# Patient Record
Sex: Female | Born: 1955 | Race: White | Hispanic: No | Marital: Single | Smoking: Current every day smoker
Health system: Southern US, Community
[De-identification: ages and names within clinical notes are randomized; demographics above are authoritative.]

## PROBLEM LIST (undated history)

## (undated) DIAGNOSIS — J449 Chronic obstructive pulmonary disease, unspecified: Secondary | ICD-10-CM

## (undated) DIAGNOSIS — K769 Liver disease, unspecified: Secondary | ICD-10-CM

## (undated) DIAGNOSIS — M1990 Unspecified osteoarthritis, unspecified site: Secondary | ICD-10-CM

## (undated) HISTORY — PX: ESOPHAGOGASTRODUODENOSCOPY: SHX1529

---

## 2016-03-17 ENCOUNTER — Ambulatory Visit (HOSPITAL_COMMUNITY): Payer: Medicaid Other | Attending: Family Medicine | Admitting: Physical Therapy

## 2016-03-17 DIAGNOSIS — M25562 Pain in left knee: Secondary | ICD-10-CM | POA: Insufficient documentation

## 2016-03-17 DIAGNOSIS — M6281 Muscle weakness (generalized): Secondary | ICD-10-CM | POA: Insufficient documentation

## 2016-03-17 DIAGNOSIS — M25561 Pain in right knee: Secondary | ICD-10-CM | POA: Insufficient documentation

## 2016-03-17 NOTE — Therapy (Signed)
[facility] [address] [place], [place], [address] Phone: [phone]   Fax:  [phone]  Physical Therapy Evaluation  Patient Details  Name: Alexis FarrierDawn Blankenship MRN: [id number] Date of Birth: 03/27/1956 Referring Provider: Francene BoyersSusan Peterson   Encounter Date: 03/17/2016      Alexis End of Session - 03/17/16 0940    Visit Number 1   Number of Visits 1   Authorization Type medicaid   Alexis Start Time 0904   Alexis Stop Time 0941   Alexis Time Calculation (min) 37 min   Activity Tolerance Patient tolerated treatment well   Behavior [facility] for tasks assessed/performed      No past medical history on file.  No past surgical history on file.  There were no vitals filed for this visit.       Subjective Assessment - 03/17/16 0906    Subjective Alexis Blankenship states that she has had knee pain since 1999 at this time she weighed over 900 lbs.  She has noticed increased knee pain in both knees in the past several months.  It is difficult for her to get up from a chair and has periodic sharp pains where it feels like her knee is going to give way.     How long can you sit comfortably? no problem    How long can you stand comfortably? 5 minutes    How long can you walk comfortably? 20 minutes    Currently in Pain? Yes   Pain Score 8    Pain Location Knee   Pain Orientation Right;Left   Pain Descriptors / Indicators Aching;Sharp   Pain Type Chronic pain   Pain Onset More than a month ago   Pain Frequency Constant   Aggravating Factors  standing or walking    Pain Relieving Factors sit down            Alexis Blankenship Alexis Assessment - 03/17/16 0001    Assessment   Medical Diagnosis Bilateral knee pain   Referring Provider Francene BoyersSusan Peterson    Onset Date/Surgical Date 12/08/15  for chronic condition   Next MD Visit unknown   Prior Therapy none   Precautions   Precautions None   Restrictions   Weight Bearing Restrictions No   Balance Screen   Has the  patient fallen in the past 6 months Yes   How many times? 1   Has the patient had a decrease in activity level because of a fear of falling?  Yes   Is the patient reluctant to leave their home because of a fear of falling?  No   Home [employer] manag[employer]   Living Environment Private residence   Prior Function   Level of Independence Independent   Vocation On disability   Leisure would like to walk but can't; watch TV, internet    Cognition   Overall Cognitive Status Within Functional Limits for tasks assessed   ROM / Strength   AROM / PROM / Strength Strength   Strength   Strength Assessment Site Hip;Knee;Ankle   Right/Left Hip Right;Left   Right Hip Flexion 2+/5   Right Hip Extension 3/5   Right Hip ABduction 4/5   Left Hip Flexion 2+/5   Left Hip Extension 3/5   Left Hip ABduction 4/5   Right/Left Knee Right;Left   Right Knee Extension 3+/5   Left Knee Extension 3+/5   Right/Left Ankle Right;Left   Right Ankle Dorsiflexion 4-/5   Left Ankle Dorsiflexion 4-/5  Alexis Education - 03/17/16 0934    Education provided Yes   Education Details Home exercise program for strengthening    Person(s) Educated Patient   Methods Explanation   Comprehension Verbalized understanding          Alexis Short Term Goals - 03/17/16 0945    Alexis SHORT TERM GOAL #1   Title Alexis to be independent in a home exercise program to improve strength and stability in both knees in order to decrease Alexis pain to be able to walk for 20 minutes without increased pain    Time 1   Period Days   Status On-going                  Plan - 03/17/16 0940    Clinical Impression Statement Alexis Blankenship is a 60 yo female who has chronic bilateral knee pain.  The patient is obese but has already lost over 400 lbs.  Her knee pain has exacerbated in the past three months to a point that both knees will give way on her without warning.  She has been referred to skilled Alexis for evaluation  and treatment.  Evaluation demonstrates decreased strength in Alexis core, hip, knee and ankle musculature.  Alexis Blankenship insurance will only cover and evaluation therefore she desires to be given a home exercise program .   Rehab Potential Good   Alexis Frequency 1x / week   Alexis Duration --  1 week   Alexis Treatment/Interventions ADLs/Self Care Home Management;Therapeutic exercise;Patient/family education   Alexis Next Visit Plan Alexis to be discharge to a home exercise program.       Patient will benefit from skilled therapeutic intervention in order to improve the following deficits and impairments:  Abnormal gait, Decreased activity tolerance, Decreased balance, Difficulty walking, Decreased strength, Pain  Visit Diagnosis: Pain in right knee - Plan: Alexis plan of care cert/re-cert  Pain in left knee - Plan: Alexis plan of care cert/re-cert  Muscle weakness (generalized) - Plan: Alexis plan of care cert/re-cert     Problem List There are no active problems to display for this patient.   Alexis Blankenship, Alexis Blankenship [phone] 03/17/2016, 9:49 AM  [facility] [address] [place], [place], [address] Phone: [phone]   Fax:  [phone]  Name: Alexis Blankenship MRN: [id number] Date of Birth: 02/06/1956

## 2016-03-17 NOTE — Patient Instructions (Addendum)
Toe Raise (Sitting)    Raise toes, keeping heels on floor. Then raise heels off the floor  Repeat _10-15___ times per set. Do __1__ sets per session. Do _3-4___ sessions per day.  http://orth.exer.us/46   Copyright  VHI. All rights reserved.  Knee Extension (Sitting)    Place __0__ pound weight on left ankle and straighten knee fully, lower slowly. Repeat __10-15__ times per set. Do _1__ sets per session. Do _3-4___ sessions per day.  http://orth.exer.us/732   Copyright  VHI. All rights reserved.  Heel Raise: Bilateral (Standing)    Rise on balls of feet. Repeat _10___ times per set. Do __1__ sets per session. Do _2___ sessions per day.  http://orth.exer.us/38   Copyright  VHI. All rights reserved.  Functional Quadriceps: Chair Squat   Hold for now.  Keeping feet flat on floor, shoulder width apart, squat as low as is comfortable. Use support as necessary. Repeat _10___ times per set. Do __1__ sets per session. Do __2__ sessions per day.  http://orth.exer.us/736   Copyright  VHI. All rights reserved.  Balance: Unilateral    Attempt to balance on left leg, eyes open. Hold _30___ seconds. Repeat __5__ times per set. Do _1___ sets per session. Do _2___ sessions per day. Perform exercise with eyes closed.  http://orth.exer.us/28   Copyright  VHI. All rights reserved.  Strengthening: Straight Leg Raise (Phase 3)    Resting on hands, tighten muscles on front of left thigh, then lift leg ___2_ inches from surface, keeping knee locked. Repeat _10___ times per set. Do _1___ sets per session. Do __2__ sessions per day.  http://orth.exer.us/618   Copyright  VHI. All rights reserved.  Strengthening: Hip Extension (Prone)   Tighten your stomach as well as your buttocks.  Then  Tighten muscles on front of left thigh, then lift leg _2___ inches from surface, keeping knee locked. Repeat _10___ times per set. Do __1__ sets per session. Do ___1_ sessions per  day.  http://orth.exer.us/620   Copyright  VHI. All rights reserved.  Self-Mobilization: Knee Flexion (Prone)    Bring left heel toward buttocks as close as possible. Hold _2___ seconds. Relax. Repeat __10__ times per set. Do __1__ sets per session. Do __2__ sessions per day.  http://orth.exer.us/596   Copyright  VHI. All rights reserved.

## 2019-09-26 ENCOUNTER — Inpatient Hospital Stay (HOSPITAL_COMMUNITY)
Admission: EM | Admit: 2019-09-26 | Discharge: 2019-10-03 | DRG: 442 | Disposition: A | Discharge status: Home / Self Care | Payer: Medicaid Other | Attending: Internal Medicine | Admitting: Internal Medicine

## 2019-09-26 ENCOUNTER — Encounter (HOSPITAL_COMMUNITY): Payer: Self-pay | Admitting: *Deleted

## 2019-09-26 ENCOUNTER — Emergency Department (HOSPITAL_COMMUNITY): Payer: Medicaid Other

## 2019-09-26 ENCOUNTER — Other Ambulatory Visit: Payer: Self-pay

## 2019-09-26 DIAGNOSIS — Z8249 Family history of ischemic heart disease and other diseases of the circulatory system: Secondary | ICD-10-CM

## 2019-09-26 DIAGNOSIS — K7581 Nonalcoholic steatohepatitis (NASH): Principal | ICD-10-CM | POA: Diagnosis present

## 2019-09-26 DIAGNOSIS — D61818 Other pancytopenia: Secondary | ICD-10-CM | POA: Diagnosis present

## 2019-09-26 DIAGNOSIS — R601 Generalized edema: Secondary | ICD-10-CM | POA: Diagnosis not present

## 2019-09-26 DIAGNOSIS — Z6841 Body Mass Index (BMI) 40.0 and over, adult: Secondary | ICD-10-CM

## 2019-09-26 DIAGNOSIS — F17210 Nicotine dependence, cigarettes, uncomplicated: Secondary | ICD-10-CM | POA: Diagnosis present

## 2019-09-26 DIAGNOSIS — Z833 Family history of diabetes mellitus: Secondary | ICD-10-CM

## 2019-09-26 DIAGNOSIS — Z20828 Contact with and (suspected) exposure to other viral communicable diseases: Secondary | ICD-10-CM | POA: Diagnosis present

## 2019-09-26 DIAGNOSIS — J449 Chronic obstructive pulmonary disease, unspecified: Secondary | ICD-10-CM | POA: Diagnosis present

## 2019-09-26 DIAGNOSIS — E876 Hypokalemia: Secondary | ICD-10-CM | POA: Diagnosis present

## 2019-09-26 DIAGNOSIS — R188 Other ascites: Secondary | ICD-10-CM

## 2019-09-26 DIAGNOSIS — E8779 Other fluid overload: Secondary | ICD-10-CM | POA: Diagnosis present

## 2019-09-26 DIAGNOSIS — E8809 Other disorders of plasma-protein metabolism, not elsewhere classified: Secondary | ICD-10-CM | POA: Diagnosis present

## 2019-09-26 DIAGNOSIS — K7460 Unspecified cirrhosis of liver: Secondary | ICD-10-CM | POA: Diagnosis present

## 2019-09-26 HISTORY — DX: Unspecified osteoarthritis, unspecified site: M19.90

## 2019-09-26 HISTORY — DX: Liver disease, unspecified: K76.9

## 2019-09-26 HISTORY — DX: Chronic obstructive pulmonary disease, unspecified: J44.9

## 2019-09-26 HISTORY — DX: Morbid (severe) obesity due to excess calories: E66.01

## 2019-09-26 LAB — COMPREHENSIVE METABOLIC PANEL
ALT: 23 U/L (ref 0–44)
AST: 42 U/L — ABNORMAL HIGH (ref 15–41)
Albumin: 2.4 g/dL — ABNORMAL LOW (ref 3.5–5.0)
Alkaline Phosphatase: 74 U/L (ref 38–126)
Anion gap: 7 (ref 5–15)
BUN: 13 mg/dL (ref 6–20)
CO2: 24 mmol/L (ref 22–32)
Calcium: 8.4 mg/dL — ABNORMAL LOW (ref 8.9–10.3)
Chloride: 110 mmol/L (ref 98–111)
Creatinine, Ser: 0.75 mg/dL (ref 0.44–1.00)
GFR calc Af Amer: >60 mL/min (ref >60)
GFR calc non Af Amer: >60 mL/min (ref >60)
Glucose, Bld: 94 mg/dL (ref 70–99)
Potassium: 3.3 mmol/L — ABNORMAL LOW (ref 3.5–5.1)
Sodium: 141 mmol/L (ref 135–145)
Total Bilirubin: 1.1 mg/dL (ref 0.3–1.2)
Total Protein: 6.7 g/dL (ref 6.5–8.1)

## 2019-09-26 LAB — LIPASE, BLOOD: Lipase: 24 U/L (ref 11–51)

## 2019-09-26 LAB — CBC WITH DIFFERENTIAL/PLATELET
Abs Immature Granulocytes: 0.01 10*3/uL (ref 0.00–0.07)
Basophils Absolute: 0 10*3/uL (ref 0.0–0.1)
Basophils Relative: 1 %
Eosinophils Absolute: 0.2 10*3/uL (ref 0.0–0.5)
Eosinophils Relative: 6 %
HCT: 45.2 % (ref 36.0–46.0)
Hemoglobin: 14.2 g/dL (ref 12.0–15.0)
Immature Granulocytes: 0 %
Lymphocytes Relative: 24 %
Lymphs Abs: 0.8 10*3/uL (ref 0.7–4.0)
MCH: 32.9 pg (ref 26.0–34.0)
MCHC: 31.4 g/dL (ref 30.0–36.0)
MCV: 104.6 fL — ABNORMAL HIGH (ref 80.0–100.0)
Monocytes Absolute: 0.4 10*3/uL (ref 0.1–1.0)
Monocytes Relative: 11 %
Neutro Abs: 1.9 10*3/uL (ref 1.7–7.7)
Neutrophils Relative %: 58 %
Platelets: 98 10*3/uL — ABNORMAL LOW (ref 150–400)
RBC: 4.32 MIL/uL (ref 3.87–5.11)
RDW: 15 % (ref 11.5–15.5)
WBC: 3.3 10*3/uL — ABNORMAL LOW (ref 4.0–10.5)
nRBC: 0 % (ref 0.0–0.2)

## 2019-09-26 LAB — PHOSPHORUS: Phosphorus: 3.3 mg/dL (ref 2.5–4.6)

## 2019-09-26 LAB — MAGNESIUM: Magnesium: 2.1 mg/dL (ref 1.7–2.4)

## 2019-09-26 MED ORDER — POTASSIUM CHLORIDE CRYS ER 20 MEQ PO TBCR
40.0000 meq | EXTENDED_RELEASE_TABLET | Freq: Once | ORAL | Status: AC
  Administered 2019-09-26: 40 meq via ORAL
  Filled 2019-09-26: qty 2

## 2019-09-26 MED ORDER — FUROSEMIDE 10 MG/ML IJ SOLN
40.0000 mg | Freq: Once | INTRAMUSCULAR | Status: AC
  Administered 2019-09-26: 40 mg via INTRAVENOUS
  Filled 2019-09-26: qty 4

## 2019-09-26 MED ORDER — IOHEXOL 300 MG/ML  SOLN
100.0000 mL | Freq: Once | INTRAMUSCULAR | Status: AC | PRN
  Administered 2019-09-26: 100 mL via INTRAVENOUS

## 2019-09-26 NOTE — ED Triage Notes (Signed)
Pt brought in by [facility] with c/o increasing abdominal fluid and swelling in legs. Pt reports she has liver disease but hasn't been taking her medications for her liver in over 6 months because she lost her doctor.

## 2019-09-26 NOTE — ED Provider Notes (Signed)
[facility] EMERGENCY DEPARTMENT Provider Note   CSN: [id number] Arrival date & time: 09/26/19  1655     History   Chief Complaint Chief Complaint  Patient presents with   Abdominal Swelling     HPI Alexis Blankenship is a 63 y.o. female.     HPI Patient is a poor historian.  States she has some type of liver disease which she was diagnosed with 4 years ago but not seen her doctor for 6 months.  She is also been off of her "fluid medication" but is unsure what this medicine loss.  She is had increased abdominal swelling as well as bilateral lower extremity swelling.  She has occasional right-sided abdominal pain though denies any currently.  She denies any nausea or vomiting.  She not had any melanotic or grossly bloody stool.  No fever or chills.  States she did have a paracentesis once about 4 years ago. Past Medical History:  Diagnosis Date   Arthritis    in knees   COPD (chronic obstructive pulmonary disease) ([place])    Liver disease    Morbid obesity ([place])     Patient Active Problem List   Diagnosis Date Noted   Anasarca 09/26/2019    Past Surgical History:  Procedure Laterality Date   CESAREAN SECTION       OB History   No obstetric history on file.      Home Medications    Prior to Admission medications   Not on File    Family History No family history on file.  Social History Social History   Tobacco Use   Smoking status: Current Every Day Smoker    Packs/day: 0.50    Types: Cigarettes   Smokeless tobacco: Never Used  Substance Use Topics   Alcohol use: Never    Frequency: Never   Drug use: Never     Allergies   Patient has no known allergies.   Review of Systems Review of Systems  Constitutional: Negative for chills and fever.  HENT: Negative for sore throat and trouble swallowing.   Eyes: Negative for visual disturbance.  Respiratory: Negative for cough and shortness of breath.   Cardiovascular: Positive for leg swelling.  Negative for chest pain.  Gastrointestinal: Positive for abdominal distention and abdominal pain. Negative for constipation, diarrhea, nausea and vomiting.  Genitourinary: Negative for dysuria, flank pain and frequency.  Musculoskeletal: Negative for back pain, myalgias, neck pain and neck stiffness.  Skin: Negative for rash and wound.  Neurological: Negative for dizziness, weakness, light-headedness, numbness and headaches.  All other systems reviewed and are negative.    Physical Exam Updated Vital Signs BP 139/82    Pulse 96    Temp 97.8 F (36.6 C) (Oral)    Resp 15    Ht 5\' 7"  (1.702 m)    Wt (!) 157.9 kg    SpO2 95%    BMI 54.50 kg/m   Physical Exam Vitals signs and nursing note reviewed.  Constitutional:      General: She is not in acute distress.    Appearance: Normal appearance. She is well-developed. She is obese. She is not ill-appearing.  HENT:     Head: Normocephalic and atraumatic.     Nose: Nose normal.     Mouth/Throat:     Mouth: Mucous membranes are moist.  Eyes:     Pupils: Pupils are equal, round, and reactive to light.  Neck:     Musculoskeletal: Normal range of motion and neck supple.  No neck rigidity or muscular tenderness.  Cardiovascular:     Rate and Rhythm: Normal rate and regular rhythm.     Heart sounds: No murmur. No friction rub. No gallop.   Pulmonary:     Effort: Pulmonary effort is normal. No respiratory distress.     Breath sounds: Normal breath sounds. No stridor. No wheezing, rhonchi or rales.  Chest:     Chest wall: No tenderness.  Abdominal:     General: Bowel sounds are normal. There is distension.     Palpations: Abdomen is soft.     Tenderness: There is no abdominal tenderness. There is no guarding or rebound.     Comments: Distended abdomen right greater than left side.  Soft, nontender.  No rebound or guarding.  Musculoskeletal: Normal range of motion.        General: No tenderness.     Right lower leg: Edema present.     Left  lower leg: Edema present.     Comments: 2+ bilateral lower extremity pitting edema.  Distal pulses intact.  Patient does have a nonruptured blister to the lateral right lower leg.  Lymphadenopathy:     Cervical: No cervical adenopathy.  Skin:    General: Skin is warm and dry.     Findings: No erythema or rash.  Neurological:     General: No focal deficit present.     Mental Status: She is alert and oriented to person, place, and time.  Psychiatric:        Behavior: Behavior normal.      ED Treatments / Results  Labs (all labs ordered are listed, but only abnormal results are displayed) Labs Reviewed  CBC WITH DIFFERENTIAL/PLATELET - Abnormal; Notable for the following components:      Result Value   WBC 3.3 (*)    MCV 104.6 (*)    Platelets 98 (*)    All other components within normal limits  COMPREHENSIVE METABOLIC PANEL - Abnormal; Notable for the following components:   Potassium 3.3 (*)    Calcium 8.4 (*)    Albumin 2.4 (*)    AST 42 (*)    All other components within normal limits  LIPASE, BLOOD  URINALYSIS, ROUTINE W REFLEX MICROSCOPIC    EKG None  Radiology Ct Abdomen Pelvis W Contrast  Result Date: 09/26/2019 CLINICAL DATA:  Abdomen distension EXAM: CT ABDOMEN AND PELVIS WITH CONTRAST TECHNIQUE: Multidetector CT imaging of the abdomen and pelvis was performed using the standard protocol following bolus administration of intravenous contrast. CONTRAST:  OMNIPAQUE IOHEXOL 300 MG/ML  SOLN COMPARISON:  CT 07/28/2015 FINDINGS: Lower chest: Lung bases demonstrate no acute consolidation or pleural effusion. The heart size is within normal limits. Mitral calcification. Hepatobiliary: Lobulated liver contour consistent with cirrhosis. No focal hepatic abnormality. Multiple stones are hyperdense sludge in the gallbladder. No biliary dilatation Pancreas: Atrophic.  No inflammatory change Spleen: Enlarged measuring 17 cm craniocaudad. Adrenals/Urinary Tract: Adrenal  glands are unremarkable. Kidneys are normal, without renal calculi, focal lesion, or hydronephrosis. Bladder is unremarkable. Stomach/Bowel: Stomach is within normal limits. Appendix not well seen. No evidence of bowel wall thickening, distention, or inflammatory changes. Vascular/Lymphatic: Moderate aortic atherosclerosis. No aneurysm. Subcentimeter upper abdominal and retroperitoneal nodes. Small distal esophageal and perigastric varices. Recanalized paraumbilical vein. Reproductive: Uterus and bilateral adnexa are unremarkable. Other: No free air. Moderate to large volume of ascites. Edema within the subcutaneous fat of the body wall. Musculoskeletal: No acute or suspicious osseous abnormality. IMPRESSION: 1. Hepatic cirrhosis with  portal hypertension as evidenced by splenomegaly and upper abdominal varices. 2. Moderate to large volume of ascites in the abdomen 3. Gallstones are hyperdense sludge in the gallbladder Electronically Signed   By: Jasmine PangKim  Fujinaga M.D.   On: 09/26/2019 22:48    Procedures Procedures (including critical care time)  Medications Ordered in ED Medications  furosemide (LASIX) injection 40 mg (has no administration in time range)  iohexol (OMNIPAQUE) 300 MG/ML solution 100 mL (100 mLs Intravenous Contrast Given 09/26/19 2213)     Initial Impression / Assessment and Plan / ED Course  I have reviewed the triage vital signs and the nursing notes.  Pertinent labs & imaging results that were available during my care of the patient were reviewed by me and considered in my medical decision making (see chart for details).        Large volume ascites on CT scan.  Will give IV Lasix and discussed with hospitalist regarding admission for therapeutic paracentesis.  Final Clinical Impressions(s) / ED Diagnoses   Final diagnoses:  Anasarca    ED Discharge Orders    None       Loren RacerYelverton, Stashia Sia, MD 09/26/19 2324

## 2019-09-26 NOTE — H&P (Signed)
History and Physical    Alexis FarrierDawn Reetz ZOX:[id number] DOB: 11/06/1956 DOA: 09/26/2019  [facility]   Patient coming from: Home.  I have personally briefly reviewed patient's old medical [facility]  Chief Complaint: Swelling.  HPI: Alexis Blankenship is a 63 y.o. female with medical history significant of osteoarthritis of the knees, COPD, morbid obesity, liver cirrhosis due to Alexis Blankenship who is coming to the emergency department due to progressively worse edema for the past month or so.  She states she has not taken her medications in several months due to lack of health care insurance.  She complains of dyspnea on exertion and orthopnea.  She denies fever, chills, sore throat, rhinorrhea, chest pain, palpitations, diaphoresis, nausea, emesis, diarrhea, melena or hematochezia.  She gets constipation occasionally.  No dysuria, frequency or hematuria.  Denies polyuria, polydipsia, polyphagia or blurred vision.  ED Course: She was giving 40 mg of furosemide in the ED.  Her urinalysis shows small hemoglobinuria, but was otherwise negative.  White count is 3.3, hemoglobin 14.2 g/dL and platelets 98.  CMP shows a potassium of 3.3 mmol/L.  All other electrolytes are within expected range, when calcium is corrected to an albumin of 2.4 g/dL.  AST was slightly elevated at 42 units/L.  The rest of the hepatic function tests are normal.  Lipase, phosphorus and magnesium were normal.  Imaging: CT abdomen shows hepatic cirrhosis with portal hypertension as evidenced by splenomegaly and upper abdominal varices.  There are gallstones and sludge in the gallbladder.  Please see images and full radiology report for further detail.  Review of Systems: As per HPI otherwise 10 point review of systems negative.   Past Medical History:  Diagnosis Date  . Arthritis    in knees  . COPD (chronic obstructive pulmonary disease) (H[facility])   . Liver disease   . Morbid obesity ([facility])      Past Surgical History:  Procedure Laterality Date  . CESAREAN SECTION       reports that she has been smoking cigarettes. She has been smoking about 0.50 packs per day. She has never used smokeless tobacco. She reports that she does not drink alcohol or use drugs.  No Known Allergies  Family medical history. Father heart disease and diabetes. Mother diabetes. Multiple siblings with type 2 diabetes.  Prior to Admission medications   Not on File    Physical Exam: Vitals:   09/26/19 1736 09/26/19 2040 09/26/19 2200 09/26/19 2230  BP: (!) 145/89 (!) 151/96 125/86 139/82  Pulse: 94 90 86 96  Resp: 18 16 (!) 25 15  Temp: 97.8 F (36.6 C)     TempSrc: Oral     SpO2: 99% 98% 98% 95%  Weight:      Height:        Constitutional: NAD, calm, comfortable Eyes: PERRL, lids and conjunctivae normal ENMT: Mucous membranes are moist. Posterior pharynx clear of any exudate or lesions. Neck: normal, supple, no masses, no thyromegaly Respiratory: Decreased breath sounds on bases, otherwise clear to auscultation bilaterally, no wheezing, no crackles. Normal respiratory effort. No accessory muscle use.  Cardiovascular: Regular rate and rhythm, no murmurs / rubs / gallops.  3+ lower extremities edema. 2+ pedal pulses. No carotid bruits.  Abdomen: Obese, distended due to anasarca.  Soft, mild diffuse tenderness without guarding or rebound, no masses palpated. No hepatosplenomegaly. Bowel sounds positive.  Musculoskeletal: no clubbing / cyanosis.  Good ROM, no contractures. Normal muscle tone.  Skin:  no rashes, lesions, ulcers on limited dermatological examination. Neurologic: CN 2-12 grossly intact. Sensation intact, DTR normal. Strength 5/5 in all 4.  Psychiatric: Normal judgment and insight. Alert and oriented x 3. Normal mood.   Labs on Admission: I have personally reviewed following labs and imaging studies  CBC: Recent Labs  Lab 09/26/19 2115  WBC 3.3*  NEUTROABS 1.9  HGB 14.2  HCT  45.2  MCV 104.6*  PLT 98*   Basic Metabolic Panel: Recent Labs  Lab 09/26/19 2115  NA 141  K 3.3*  CL 110  CO2 24  GLUCOSE 94  BUN 13  CREATININE 0.75  CALCIUM 8.4*   GFR: Estimated Creatinine Clearance: 118.2 mL/min (by C-G formula based on SCr of 0.75 mg/dL). Liver Function Tests: Recent Labs  Lab 09/26/19 2115  AST 42*  ALT 23  ALKPHOS 74  BILITOT 1.1  PROT 6.7  ALBUMIN 2.4*   Recent Labs  Lab 09/26/19 2115  LIPASE 24   No results for input(s): AMMONIA in the last 168 hours. Coagulation Profile: No results for input(s): INR, PROTIME in the last 168 hours. Cardiac Enzymes: No results for input(s): CKTOTAL, CKMB, CKMBINDEX, TROPONINI in the last 168 hours. BNP (last 3 results) No results for input(s): PROBNP in the last 8760 hours. HbA1C: No results for input(s): HGBA1C in the last 72 hours. CBG: No results for input(s): GLUCAP in the last 168 hours. Lipid Profile: No results for input(s): CHOL, HDL, LDLCALC, TRIG, CHOLHDL, LDLDIRECT in the last 72 hours. Thyroid Function Tests: No results for input(s): TSH, T4TOTAL, FREET4, T3FREE, THYROIDAB in the last 72 hours. Anemia Panel: No results for input(s): VITAMINB12, FOLATE, FERRITIN, TIBC, IRON, RETICCTPCT in the last 72 hours. Urine analysis: No results found for: COLORURINE, APPEARANCEUR, LABSPEC, [place], GLUCOSEU, HGBUR, BILIRUBINUR, KETONESUR, PROTEINUR, UROBILINOGEN, NITRITE, LEUKOCYTESUR  Radiological Exams on Admission: Ct Abdomen Pelvis W Contrast  Result Date: 09/26/2019 CLINICAL DATA:  Abdomen distension EXAM: CT ABDOMEN AND PELVIS WITH CONTRAST TECHNIQUE: Multidetector CT imaging of the abdomen and pelvis was performed using the standard protocol following bolus administration of intravenous contrast. CONTRAST:  162mL OMNIPAQUE IOHEXOL 300 MG/ML  SOLN COMPARISON:  CT 07/28/2015 FINDINGS: Lower chest: Lung bases demonstrate no acute consolidation or pleural effusion. The heart size is within normal  limits. Mitral calcification. Hepatobiliary: Lobulated liver contour consistent with cirrhosis. No focal hepatic abnormality. Multiple stones are hyperdense sludge in the gallbladder. No biliary dilatation Pancreas: Atrophic.  No inflammatory change Spleen: Enlarged measuring 17 cm craniocaudad. Adrenals/Urinary Tract: Adrenal glands are unremarkable. Kidneys are normal, without renal calculi, focal lesion, or hydronephrosis. Bladder is unremarkable. Stomach/Bowel: Stomach is within normal limits. Appendix not well seen. No evidence of bowel wall thickening, distention, or inflammatory changes. Vascular/Lymphatic: Moderate aortic atherosclerosis. No aneurysm. Subcentimeter upper abdominal and retroperitoneal nodes. Small distal esophageal and perigastric varices. Recanalized paraumbilical vein. Reproductive: Uterus and bilateral adnexa are unremarkable. Other: No free air. Moderate to large volume of ascites. Edema within the subcutaneous fat of the body wall. Musculoskeletal: No acute or suspicious osseous abnormality. IMPRESSION: 1. Hepatic cirrhosis with portal hypertension as evidenced by splenomegaly and upper abdominal varices. 2. Moderate to large volume of ascites in the abdomen 3. Gallstones are hyperdense sludge in the gallbladder Electronically Signed   By: Donavan Foil M.D.   On: 09/26/2019 22:48    EKG: Independently reviewed.   Assessment/Plan Principal Problem:   Anasarca Observation/telemetry. Supplemental oxygen as needed. Fluid and sodium restriction. Furosemide 60 mg IVP twice a day. Consider IR guided  therapeutic paracentesis.  Active Problems:   Liver cirrhosis secondary to NASH (HCC) Child-Pugh Score of 9. MELD Score of 8. Continue treatment as above. Outpatient or inpatient evaluation by GI. She will require EGD and potentially esophageal varices banding.    Morbid obesity (HCC) Needs significant lifestyle modifications.    COPD (chronic obstructive pulmonary  disease) (HCC) Smoking cessation recommended. Nicotine patch offered. Supplemental oxygen and bronchodilators as needed.    Hypokalemia Supplemented potassium. Follow-up level.   DVT prophylaxis: SCDs. Code Status: Full code. Family Communication: Disposition Plan: 24 to 48-hour observation for IV furosemide treatment. Consults called: Admission status: Observation/telemetry.   Bobette Mo MD [facility]  If 7PM-7AM, please contact night-coverage  09/26/2019, 11:33 PM   This document was prepared using Dragon voice recognition software and may contain some unintended transcription errors.

## 2019-09-26 NOTE — ED Notes (Signed)
Pt has fluid filled blister to R lower leg

## 2019-09-27 ENCOUNTER — Encounter (HOSPITAL_COMMUNITY): Payer: Self-pay | Admitting: Internal Medicine

## 2019-09-27 ENCOUNTER — Observation Stay (HOSPITAL_COMMUNITY): Payer: Medicaid Other

## 2019-09-27 ENCOUNTER — Telehealth: Payer: Self-pay | Admitting: Gastroenterology

## 2019-09-27 DIAGNOSIS — E8809 Other disorders of plasma-protein metabolism, not elsewhere classified: Secondary | ICD-10-CM | POA: Diagnosis present

## 2019-09-27 DIAGNOSIS — D61818 Other pancytopenia: Secondary | ICD-10-CM | POA: Diagnosis present

## 2019-09-27 DIAGNOSIS — F17210 Nicotine dependence, cigarettes, uncomplicated: Secondary | ICD-10-CM | POA: Diagnosis present

## 2019-09-27 DIAGNOSIS — Z8249 Family history of ischemic heart disease and other diseases of the circulatory system: Secondary | ICD-10-CM | POA: Diagnosis not present

## 2019-09-27 DIAGNOSIS — E876 Hypokalemia: Secondary | ICD-10-CM | POA: Diagnosis present

## 2019-09-27 DIAGNOSIS — K7581 Nonalcoholic steatohepatitis (NASH): Secondary | ICD-10-CM | POA: Diagnosis present

## 2019-09-27 DIAGNOSIS — J449 Chronic obstructive pulmonary disease, unspecified: Secondary | ICD-10-CM | POA: Diagnosis present

## 2019-09-27 DIAGNOSIS — K7460 Unspecified cirrhosis of liver: Secondary | ICD-10-CM | POA: Diagnosis present

## 2019-09-27 DIAGNOSIS — Z833 Family history of diabetes mellitus: Secondary | ICD-10-CM | POA: Diagnosis not present

## 2019-09-27 DIAGNOSIS — R601 Generalized edema: Secondary | ICD-10-CM | POA: Diagnosis present

## 2019-09-27 DIAGNOSIS — R188 Other ascites: Secondary | ICD-10-CM | POA: Diagnosis present

## 2019-09-27 DIAGNOSIS — Z20828 Contact with and (suspected) exposure to other viral communicable diseases: Secondary | ICD-10-CM | POA: Diagnosis present

## 2019-09-27 DIAGNOSIS — Z6841 Body Mass Index (BMI) 40.0 and over, adult: Secondary | ICD-10-CM | POA: Diagnosis not present

## 2019-09-27 DIAGNOSIS — E8779 Other fluid overload: Secondary | ICD-10-CM | POA: Diagnosis present

## 2019-09-27 LAB — URINALYSIS, ROUTINE W REFLEX MICROSCOPIC
Bacteria, UA: NONE SEEN
Bilirubin Urine: NEGATIVE
Glucose, UA: NEGATIVE mg/dL
Ketones, ur: NEGATIVE mg/dL
Leukocytes,Ua: NEGATIVE
Nitrite: NEGATIVE
Protein, ur: NEGATIVE mg/dL
Specific Gravity, Urine: 1.024 (ref 1.005–1.030)
pH: 5 (ref 5.0–8.0)

## 2019-09-27 LAB — COMPREHENSIVE METABOLIC PANEL
ALT: 21 U/L (ref 0–44)
AST: 40 U/L (ref 15–41)
Albumin: 2.2 g/dL — ABNORMAL LOW (ref 3.5–5.0)
Alkaline Phosphatase: 62 U/L (ref 38–126)
Anion gap: 9 (ref 5–15)
BUN: 12 mg/dL (ref 6–20)
CO2: 23 mmol/L (ref 22–32)
Calcium: 8.4 mg/dL — ABNORMAL LOW (ref 8.9–10.3)
Chloride: 112 mmol/L — ABNORMAL HIGH (ref 98–111)
Creatinine, Ser: 0.71 mg/dL (ref 0.44–1.00)
GFR calc Af Amer: >60 mL/min (ref >60)
GFR calc non Af Amer: >60 mL/min (ref >60)
Glucose, Bld: 90 mg/dL (ref 70–99)
Potassium: 3.7 mmol/L (ref 3.5–5.1)
Sodium: 144 mmol/L (ref 135–145)
Total Bilirubin: 1.3 mg/dL — ABNORMAL HIGH (ref 0.3–1.2)
Total Protein: 5.8 g/dL — ABNORMAL LOW (ref 6.5–8.1)

## 2019-09-27 LAB — CBC
HCT: 40.5 % (ref 36.0–46.0)
Hemoglobin: 13 g/dL (ref 12.0–15.0)
MCH: 33.2 pg (ref 26.0–34.0)
MCHC: 32.1 g/dL (ref 30.0–36.0)
MCV: 103.6 fL — ABNORMAL HIGH (ref 80.0–100.0)
Platelets: 92 10*3/uL — ABNORMAL LOW (ref 150–400)
RBC: 3.91 MIL/uL (ref 3.87–5.11)
RDW: 15 % (ref 11.5–15.5)
WBC: 3.6 10*3/uL — ABNORMAL LOW (ref 4.0–10.5)
nRBC: 0 % (ref 0.0–0.2)

## 2019-09-27 LAB — GRAM STAIN: Gram Stain: NONE SEEN

## 2019-09-27 LAB — BODY FLUID CELL COUNT WITH DIFFERENTIAL
Eos, Fluid: 0 %
Lymphs, Fluid: 78 %
Monocyte-Macrophage-Serous Fluid: 18 % — ABNORMAL LOW (ref 50–90)
Neutrophil Count, Fluid: 4 % (ref 0–25)
Other Cells, Fluid: 1 %
Total Nucleated Cell Count, Fluid: 101 cu mm (ref 0–1000)

## 2019-09-27 LAB — HIV ANTIBODY (ROUTINE TESTING W REFLEX): HIV Screen 4th Generation wRfx: NONREACTIVE

## 2019-09-27 LAB — PROTIME-INR
INR: 1.1 (ref 0.8–1.2)
Prothrombin Time: 13.9 seconds (ref 11.4–15.2)

## 2019-09-27 LAB — SARS CORONAVIRUS 2 (TAT 6-24 HRS): SARS Coronavirus 2: NEGATIVE

## 2019-09-27 MED ORDER — FUROSEMIDE 10 MG/ML IJ SOLN
60.0000 mg | Freq: Two times a day (BID) | INTRAMUSCULAR | Status: DC
  Administered 2019-09-27 – 2019-10-03 (×12): 60 mg via INTRAVENOUS
  Filled 2019-09-27 (×13): qty 6

## 2019-09-27 MED ORDER — INFLUENZA VAC SPLIT QUAD 0.5 ML IM SUSY
0.5000 mL | PREFILLED_SYRINGE | INTRAMUSCULAR | Status: DC
  Filled 2019-09-27: qty 0.5

## 2019-09-27 MED ORDER — SPIRONOLACTONE 25 MG PO TABS
100.0000 mg | ORAL_TABLET | Freq: Every day | ORAL | Status: DC
  Administered 2019-09-27 – 2019-10-03 (×7): 100 mg via ORAL
  Filled 2019-09-27 (×8): qty 4

## 2019-09-27 MED ORDER — POTASSIUM CHLORIDE CRYS ER 20 MEQ PO TBCR
20.0000 meq | EXTENDED_RELEASE_TABLET | Freq: Two times a day (BID) | ORAL | Status: DC
  Administered 2019-09-27 – 2019-10-03 (×13): 20 meq via ORAL
  Filled 2019-09-27 (×13): qty 1

## 2019-09-27 NOTE — Procedures (Signed)
   US guided LLQ paracentesis  4 L yellow fluid Sent for labs per MD  Tolerated well

## 2019-09-27 NOTE — Telephone Encounter (Signed)
Can we try and get records from [facility]/Dr. Britta Mccreedy?  Looking for OV notes (last two), EGD and path, last two discharge summaries from [facility].

## 2019-09-27 NOTE — ED Notes (Signed)
Pt placed in hospital bed for comfort due to holding pt

## 2019-09-27 NOTE — Progress Notes (Signed)
Paracentesis complete no signs of distress.  

## 2019-09-27 NOTE — ED Notes (Signed)
ED Provider at bedside. 

## 2019-09-27 NOTE — Progress Notes (Signed)
PROGRESS NOTE    Alexis Blankenship  ZOX:[id number] DOB: 01/13/1956 DOA: 09/26/2019 [facility]   Brief Narrative:  Per HPI: Alexis Blankenship is a 63 y.o. female with medical history significant of osteoarthritis of the knees, COPD, morbid obesity, liver cirrhosis due to [name] who is coming to the emergency department due to progressively worse edema for the past month or so.  She states she has not taken her medications in several months due to lack of health care insurance.  She complains of dyspnea on exertion and orthopnea.  She denies fever, chills, sore throat, rhinorrhea, chest pain, palpitations, diaphoresis, nausea, emesis, diarrhea, melena or hematochezia.  She gets constipation occasionally.  No dysuria, frequency or hematuria.  Denies polyuria, polydipsia, polyphagia or blurred vision.  10/22: Patient was admitted with anasarca secondary to poor outpatient follow-up and lack of medications over the last several months in the setting of history of NASH liver cirrhosis.  She has been started on IV Lasix 60 mg twice daily.  She has undergone paracentesis with 4 L of fluid removed.  GI consultation ordered and pending.  Assessment & Plan:   Principal Problem:   Anasarca Active Problems:   Liver cirrhosis secondary to NASH (HCC)   Morbid obesity (HCC)   COPD (chronic obstructive pulmonary disease) (HCC)   Hypokalemia   Anasarca secondary to Elita BooneNash liver cirrhosis -Appreciate GI consultation -Patient is status post paracentesis of 4 L of fluid -Continue on fluid and sodium restriction -Continue Lasix 60 mg IV twice daily -Monitor daily weights and strict I's and O's -Patient has had poor follow-up and lack of medications -Albumin 2.2  Thrombocytopenia-stable -Secondary to cirrhosis -Monitor CBC -Avoid heparin agents  Morbid obesity -Lifestyle interventions  COPD with tobacco abuse -Counseled on smoking cessation  Hypokalemia -Currently  supplemented -We will continue supplementation with aggressive IV Lasix -Check magnesium in a.m. -Monitor a.m. labs  DVT prophylaxis: SCDs Code Status: Full Family Communication: None at bedside, patient does have a son Disposition Plan: Continue IV diuresis.  Appreciate GI evaluation.  Fluid analysis from paracentesis.   Consultants:   GI  Procedures:   Paracentesis of 4 L of fluid on 10/22  Antimicrobials:   None   Subjective: Patient seen and evaluated today with no new acute complaints or concerns. No acute concerns or events noted overnight.  Objective: Vitals:   09/27/19 0930 09/27/19 0945 09/27/19 1000 09/27/19 1032  BP: 120/77  106/72 116/66  Pulse: 95 93 96 95  Resp:      Temp:      TempSrc:      SpO2: 98% 97% 98% 98%  Weight:      Height:        Intake/Output Summary (Last 24 hours) at 09/27/2019 1252 Last data filed at 09/27/2019 0330 Gross per 24 hour  Intake --  Output 1500 ml  Net -1500 ml   Filed Weights   09/26/19 1732  Weight: (!) 157.9 kg    Examination:  General exam: Appears calm and comfortable, morbidly obese and volume overloaded Respiratory system: Clear to auscultation. Respiratory effort normal. Cardiovascular system: S1 & S2 heard, RRR. No JVD, murmurs, rubs, gallops or clicks.  Significant bilateral fluid edema noted along with blisters Gastrointestinal system: Abdomen is distended, soft and nontender. No organomegaly or masses felt. Normal bowel sounds heard. Central nervous system: Alert and oriented. No focal neurological deficits. Extremities: Symmetric 5 x 5 power. Skin: No rashes, lesions or ulcers Psychiatry: Judgement and insight appear  normal. Mood & affect appropriate.     Data Reviewed: I have personally reviewed following labs and imaging studies  CBC: Recent Labs  Lab 09/26/19 2115 09/27/19 0401  WBC 3.3* 3.6*  NEUTROABS 1.9  --   HGB 14.2 13.0  HCT 45.2 40.5  MCV 104.6* 103.6*  PLT 98* 92*   Basic  Metabolic Panel: Recent Labs  Lab 09/26/19 2115 09/27/19 0401  NA 141 144  K 3.3* 3.7  CL 110 112*  CO2 24 23  GLUCOSE 94 90  BUN 13 12  CREATININE 0.75 0.71  CALCIUM 8.4* 8.4*  MG 2.1  --   PHOS 3.3  --    GFR: Estimated Creatinine Clearance: 118.2 mL/min (by C-G formula based on SCr of 0.71 mg/dL). Liver Function Tests: Recent Labs  Lab 09/26/19 2115 09/27/19 0401  AST 42* 40  ALT 23 21  ALKPHOS 74 62  BILITOT 1.1 1.3*  PROT 6.7 5.8*  ALBUMIN 2.4* 2.2*   Recent Labs  Lab 09/26/19 2115  LIPASE 24   No results for input(s): AMMONIA in the last 168 hours. Coagulation Profile: Recent Labs  Lab 09/27/19 0401  INR 1.1   Cardiac Enzymes: No results for input(s): CKTOTAL, CKMB, CKMBINDEX, TROPONINI in the last 168 hours. BNP (last 3 results) No results for input(s): PROBNP in the last 8760 hours. HbA1C: No results for input(s): HGBA1C in the last 72 hours. CBG: No results for input(s): GLUCAP in the last 168 hours. Lipid Profile: No results for input(s): CHOL, HDL, LDLCALC, TRIG, CHOLHDL, LDLDIRECT in the last 72 hours. Thyroid Function Tests: No results for input(s): TSH, T4TOTAL, FREET4, T3FREE, THYROIDAB in the last 72 hours. Anemia Panel: No results for input(s): VITAMINB12, FOLATE, FERRITIN, TIBC, IRON, RETICCTPCT in the last 72 hours. Sepsis Labs: No results for input(s): PROCALCITON, LATICACIDVEN in the last 168 hours.  Recent Results (from the past 240 hour(s))  Culture, body fluid-bottle     Status: None (Preliminary result)   Collection Time: 09/27/19  9:45 AM   Specimen: Ascitic  Result Value Ref Range Status   Specimen Description ASCITIC  Final   Special Requests   Final    BOTTLES DRAWN AEROBIC AND ANAEROBIC 10CC Performed at [facility], [address], [place], [place] [address]    Culture PENDING  Incomplete   Report Status PENDING  Incomplete  Gram stain     Status: None   Collection Time: 09/27/19  9:52 AM   Specimen: Ascitic; Body  Fluid  Result Value Ref Range Status   Specimen Description ASCITIC  Final   Special Requests NONE  Final   Gram Stain   Final    NO ORGANISMS SEEN WBC PRESENT, PREDOMINANTLY MONONUCLEAR CYTOSPIN SMEAR Performed at [facility], [address], [place], [place] [address]    Report Status 09/27/2019 FINAL  Final         Radiology Studies: Ct Abdomen Pelvis W Contrast  Result Date: 09/26/2019 CLINICAL DATA:  Abdomen distension EXAM: CT ABDOMEN AND PELVIS WITH CONTRAST TECHNIQUE: Multidetector CT imaging of the abdomen and pelvis was performed using the standard protocol following bolus administration of intravenous contrast. CONTRAST:  159mL OMNIPAQUE IOHEXOL 300 MG/ML  SOLN COMPARISON:  CT 07/28/2015 FINDINGS: Lower chest: Lung bases demonstrate no acute consolidation or pleural effusion. The heart size is within normal limits. Mitral calcification. Hepatobiliary: Lobulated liver contour consistent with cirrhosis. No focal hepatic abnormality. Multiple stones are hyperdense sludge in the gallbladder. No biliary dilatation Pancreas: Atrophic.  No inflammatory change  Spleen: Enlarged measuring 17 cm craniocaudad. Adrenals/Urinary Tract: Adrenal glands are unremarkable. Kidneys are normal, without renal calculi, focal lesion, or hydronephrosis. Bladder is unremarkable. Stomach/Bowel: Stomach is within normal limits. Appendix not well seen. No evidence of bowel wall thickening, distention, or inflammatory changes. Vascular/Lymphatic: Moderate aortic atherosclerosis. No aneurysm. Subcentimeter upper abdominal and retroperitoneal nodes. Small distal esophageal and perigastric varices. Recanalized paraumbilical vein. Reproductive: Uterus and bilateral adnexa are unremarkable. Other: No free air. Moderate to large volume of ascites. Edema within the subcutaneous fat of the body wall. Musculoskeletal: No acute or suspicious osseous abnormality. IMPRESSION: 1. Hepatic cirrhosis with portal hypertension as  evidenced by splenomegaly and upper abdominal varices. 2. Moderate to large volume of ascites in the abdomen 3. Gallstones are hyperdense sludge in the gallbladder Electronically Signed   By: Jasmine Pang M.D.   On: 09/26/2019 22:48   US Paracentesis  Result Date: 09/27/2019 INDICATION: Ascites; distension EXAM: ULTRASOUND GUIDED LLQ PARACENTESIS MEDICATIONS: 10 cc 1% lidocaine COMPLICATIONS: None immediate. PROCEDURE: Informed written consent was obtained from the patient after a discussion of the risks, benefits and alternatives to treatment. A timeout was performed prior to the initiation of the procedure. Initial ultrasound scanning demonstrates a large amount of ascites within the left lower abdominal quadrant. The left lower abdomen was prepped and draped in the usual sterile fashion. 1% lidocaine was used for local anesthesia. Following this, a 15 cm 19 g Yueh catheter was introduced. An ultrasound image was saved for documentation purposes. The paracentesis was performed. The catheter was removed and a dressing was applied. The patient tolerated the procedure well without immediate post procedural complication. FINDINGS: A total of approximately 4 liters of yellow fluid was removed. Samples were sent to the laboratory as requested by the clinical team. IMPRESSION: Successful ultrasound-guided paracentesis yielding 4 liters of peritoneal fluid. Read by Robet Leu [facility] Electronically Signed   By: Ulyses Southward M.D.   On: 09/27/2019 10:12        Scheduled Meds:  furosemide  60 mg Intravenous BID   [START ON 09/28/2019] influenza vac split quadrivalent PF  0.5 mL Intramuscular Tomorrow-1000   potassium chloride  20 mEq Oral BID   Continuous Infusions:   LOS: 0 days    Time spent: 30 minutes    Senna Lape Hoover Brunette, DO [facility] Pager [phone]  If 7PM-7AM, please contact night-coverage [email or web address] Password [email or web address] 09/27/2019, 12:52 PM

## 2019-09-27 NOTE — Consult Note (Signed)
Referring Provider: Erick BlinksShah, Pratik D, DO Primary [facility]  [facility] Primary Gastroenterologist:  Roetta SessionsMichael Rourk, MD (Previously Dr. Teena DunkBenson before he left the area)  Reason for Consultation:  Cirrhosis, anasarca  HPI: Lenoria FarrierDawn Farler is a 63 y.o. female with h/o NASH cirrhosis (originally diagnosed several years ago by Dr. Teena DunkBenson), morbid obesity, COPD who presented to ED for one month history of worsening edema. She has not seen her PCP or GI in over three years. She has been trying "manage things myself". She reports awareness to follow 2 gram sodium diet but feels like tight finances as kept her from eating fresh fruit/veggies/meats. She has went back to eating processed foods. Before she managed her edema with prn diuretics. She has not had any in years. Over the past one month she has noted edema in the lower extremities all the way up to the abdomen/groin. She has had more difficulty with mobility given her size/edema, DOE. She denies abdominal pain, heartburn, constipation, diarrhea, melena, brbpr. She denies confusions. She developed two large "water" blisters on her right lateral shin.   In ED she had CT A/P with contrast showing cirrhosis with no focal hepatic abnormality, multiple stones in gallbladder, enlarged spleen. Small distal esophageal and perigastric varices. Moderate to large volume ascites. Edema within the subcutaneous fat of the body wall. US paracentesis with removal of 4 liters of fluid.   Labs yesterday with wbc 3.3, platelets 98,000, albumin 2.4, AST 42, K 3.3. today her INR 1.1. cell count unremarkable.   Admission weight 348 pounds.   Patient believes she has been checked for viral hepatitis. She denies previous hep A/B vaccines. She has 3 tattoos. She gave herself one at home with clean needles/ink.   Prior to Admission medications   Not on File    Current Facility-Administered Medications  Medication Dose Route Frequency Provider  Last Rate Last Dose  . furosemide (LASIX) injection 60 mg  60 mg Intravenous BID Bobette Mortiz, David Manuel, MD   60 mg at 09/27/19 [id number]  . [START ON 09/28/2019] influenza vac split quadrivalent PF (FLUARIX) injection 0.5 mL  0.5 mL Intramuscular Tomorrow-1000 Shah, Pratik D, DO      . potassium chloride SA (KLOR-CON) CR tablet 20 mEq  20 mEq Oral BID Bobette Mortiz, David Manuel, MD   20 mEq at 09/27/19 1127   No current outpatient medications on file.    Allergies as of 09/26/2019  . (No Known Allergies)    Past Medical History:  Diagnosis Date  . Arthritis    in knees  . COPD (chronic obstructive pulmonary disease) (HCC)   . Liver disease   . Morbid obesity (HCC)     Past Surgical History:  Procedure Laterality Date  . CESAREAN SECTION      Family History  Problem Relation Age of Onset  . Diabetes Mother   . Diabetes Father   . Heart disease Father   . Diabetes Sister   . Diabetes Brother     Social History   Socioeconomic History  . Marital status: Single    Spouse name: Not on file  . Number of children: Not on file  . Years of education: Not on file  . Highest education level: Not on file  Occupational History  . Not on file  Social Needs  . Financial resource strain: Not on file  . Food insecurity    Worry: Not on file    Inability: Not on file  . Transportation needs  Medical: Not on file    Non-medical: Not on file  Tobacco Use  . Smoking status: Current Every Day Smoker    Packs/day: 0.50    Types: Cigarettes  . Smokeless tobacco: Never Used  Substance and Sexual Activity  . Alcohol use: Never    Frequency: Never  . Drug use: Never  . Sexual activity: Not on file  Lifestyle  . Physical activity    Days per week: Not on file    Minutes per session: Not on file  . Stress: Not on file  Relationships  . Social Musician on phone: Not on file    Gets together: Not on file    Attends religious service: Not on file    Active member of club or  organization: Not on file    Attends meetings of clubs or organizations: Not on file    Relationship status: Not on file  . Intimate partner violence    Fear of current or ex partner: Not on file    Emotionally abused: Not on file    Physically abused: Not on file    Forced sexual activity: Not on file  Other Topics Concern  . Not on file  Social History Narrative  . Not on file     ROS:  General: Negative for anorexia, weight loss, fever, chills, fatigue, weakness. Eyes: Negative for vision changes.  ENT: Negative for hoarseness, difficulty swallowing , nasal congestion. CV: Negative for chest pain, angina, palpitations, +dyspnea on exertion,+peripheral edema.  Respiratory: Negative for dyspnea at rest, +dyspnea on exertion, cough, sputum, wheezing.  GI: See history of present illness. GU:  Negative for dysuria, hematuria, urinary incontinence, urinary frequency, nocturnal urination.  MS: Negative for joint pain, low back pain.  Derm: Negative for rash or itching.  Neuro: Negative for weakness, abnormal sensation, seizure, frequent headaches, memory loss, confusion.  Psych: Negative for anxiety, depression, suicidal ideation, hallucinations.  Endo: Negative for unusual weight change.  Heme: Negative for bruising or bleeding. Allergy: Negative for rash or hives.       Physical Examination: Vital signs in last 24 hours: Temp:  [97.8 F (36.6 C)] 97.8 F (36.6 C) (10/21 1736) Pulse Rate:  [86-96] 95 (10/22 1032) Resp:  [15-25] 24 (10/22 0431) BP: (95-151)/(62-96) 116/66 (10/22 1032) SpO2:  [93 %-99 %] 98 % (10/22 1032) Weight:  [157.9 kg] 157.9 kg (10/21 1732)    General: morbidly obese, WF in no acute distress.  Head: Normocephalic, atraumatic.   Eyes: Conjunctiva pink, no icterus. Mouth: Oropharyngeal mucosa moist and pink , no lesions erythema or exudate. Neck: Supple without thyromegaly, masses, or lymphadenopathy.  Lungs: Clear to auscultation bilaterally.  Heart:  Regular rate and rhythm, no murmurs rubs or gallops.  Abdomen: Bowel sounds are normal, nontender, obese, body habitus limits exam. Dependent pitting edema noted in lower abdomen and flanks. pitting edema of suprapubic region. no abdominal bruits or    hernia , no rebound or guarding.   Rectal: not performed Extremities: 3-4+ pitting edema of bilateral lower extremity edema with edema extending up to pelvic area, clubbing, deformity.  Neuro: Alert and oriented x 4 , grossly normal neurologically.  Skin: Warm and dry, no rash or jaundice. Two lemon size blisters noted on right shin with serosangenous fluid   Psych: Alert and cooperative, normal mood and affect.        Intake/Output from previous day: 10/21 0701 - 10/22 0700 In: -  Out: 1500 [Urine:1500] Intake/Output this shift:  No intake/output data recorded.  Lab Results: CBC Recent Labs    09/26/19 2115 09/27/19 0401  WBC 3.3* 3.6*  HGB 14.2 13.0  HCT 45.2 40.5  MCV 104.6* 103.6*  PLT 98* 92*   BMET Recent Labs    09/26/19 2115 09/27/19 0401  NA 141 144  K 3.3* 3.7  CL 110 112*  CO2 24 23  GLUCOSE 94 90  BUN 13 12  CREATININE 0.75 0.71  CALCIUM 8.4* 8.4*   LFT Recent Labs    09/26/19 2115 09/27/19 0401  BILITOT 1.1 1.3*  ALKPHOS 74 62  AST 42* 40  ALT 23 21  PROT 6.7 5.8*  ALBUMIN 2.4* 2.2*    Lipase Recent Labs    09/26/19 2115  LIPASE 24    PT/INR Recent Labs    09/27/19 0401  LABPROT 13.9  INR 1.1      Imaging Studies: Ct Abdomen Pelvis W Contrast  Result Date: 09/26/2019 CLINICAL DATA:  Abdomen distension EXAM: CT ABDOMEN AND PELVIS WITH CONTRAST TECHNIQUE: Multidetector CT imaging of the abdomen and pelvis was performed using the standard protocol following bolus administration of intravenous contrast. CONTRAST:  OMNIPAQUE IOHEXOL 300 MG/ML  SOLN COMPARISON:  CT 07/28/2015 FINDINGS: Lower chest: Lung bases demonstrate no acute consolidation or pleural effusion. The heart size is  within normal limits. Mitral calcification. Hepatobiliary: Lobulated liver contour consistent with cirrhosis. No focal hepatic abnormality. Multiple stones are hyperdense sludge in the gallbladder. No biliary dilatation Pancreas: Atrophic.  No inflammatory change Spleen: Enlarged measuring 17 cm craniocaudad. Adrenals/Urinary Tract: Adrenal glands are unremarkable. Kidneys are normal, without renal calculi, focal lesion, or hydronephrosis. Bladder is unremarkable. Stomach/Bowel: Stomach is within normal limits. Appendix not well seen. No evidence of bowel wall thickening, distention, or inflammatory changes. Vascular/Lymphatic: Moderate aortic atherosclerosis. No aneurysm. Subcentimeter upper abdominal and retroperitoneal nodes. Small distal esophageal and perigastric varices. Recanalized paraumbilical vein. Reproductive: Uterus and bilateral adnexa are unremarkable. Other: No free air. Moderate to large volume of ascites. Edema within the subcutaneous fat of the body wall. Musculoskeletal: No acute or suspicious osseous abnormality. IMPRESSION: 1. Hepatic cirrhosis with portal hypertension as evidenced by splenomegaly and upper abdominal varices. 2. Moderate to large volume of ascites in the abdomen 3. Gallstones are hyperdense sludge in the gallbladder Electronically Signed   By: Jasmine Pang M.D.   On: 09/26/2019 22:48   US Paracentesis  Result Date: 09/27/2019 INDICATION: Ascites; distension EXAM: ULTRASOUND GUIDED LLQ PARACENTESIS MEDICATIONS: 10 cc 1% lidocaine COMPLICATIONS: None immediate. PROCEDURE: Informed written consent was obtained from the patient after a discussion of the risks, benefits and alternatives to treatment. A timeout was performed prior to the initiation of the procedure. Initial ultrasound scanning demonstrates a large amount of ascites within the left lower abdominal quadrant. The left lower abdomen was prepped and draped in the usual sterile fashion. 1% lidocaine was used for  local anesthesia. Following this, a 15 cm 19 g Yueh catheter was introduced. An ultrasound image was saved for documentation purposes. The paracentesis was performed. The catheter was removed and a dressing was applied. The patient tolerated the procedure well without immediate post procedural complication. FINDINGS: A total of approximately 4 liters of yellow fluid was removed. Samples were sent to the laboratory as requested by the clinical team. IMPRESSION: Successful ultrasound-guided paracentesis yielding 4 liters of peritoneal fluid. Read by Robet Leu [facility] Electronically Signed   By: Ulyses Southward M.D.   On: 09/27/2019 10:12  [4 week]   Impression:  Pleasant 63 y/o female with reported history of NASH cirrhosis (previously evaluated by Dr. Britta Mccreedy) presented with decompensation with noted anasarca. She has had anasarca several years back but reports she was managing up until a couple of months ago. She has not seen her PCP or GI in several years. Denies h/o heart disease which could be contributing to her edema. She has significant hypoalbuminemia. Current MELD Na of 8.    Plan: 1. F/u pending fluid results.  2. Add aldactone 100mg  PO daily. Consider lasix 40mg  daily as outpatient but continue IV for now.  3. Strict I/Os. 4. Monitor diuresis, she may require IV albumin along with her lasix.  5. Cirrhosis care. Will try to obtain copy of records from Dr. Britta Mccreedy. She reports previous EGD. No prior colonoscopy. She is aware that she will require close outpatient follow up from here on out.    We would like to thank you for the opportunity to participate in the care of [place].  Laureen Ochs. Bernarda Caffey [facility] [phone] 10/22/20205:36 PM     LOS: 0 days

## 2019-09-28 DIAGNOSIS — R188 Other ascites: Secondary | ICD-10-CM

## 2019-09-28 DIAGNOSIS — R601 Generalized edema: Secondary | ICD-10-CM | POA: Diagnosis not present

## 2019-09-28 LAB — CBC
HCT: 38 % (ref 36.0–46.0)
Hemoglobin: 11.9 g/dL — ABNORMAL LOW (ref 12.0–15.0)
MCH: 32.5 pg (ref 26.0–34.0)
MCHC: 31.3 g/dL (ref 30.0–36.0)
MCV: 103.8 fL — ABNORMAL HIGH (ref 80.0–100.0)
Platelets: 92 10*3/uL — ABNORMAL LOW (ref 150–400)
RBC: 3.66 MIL/uL — ABNORMAL LOW (ref 3.87–5.11)
RDW: 14.9 % (ref 11.5–15.5)
WBC: 3.2 10*3/uL — ABNORMAL LOW (ref 4.0–10.5)
nRBC: 0 % (ref 0.0–0.2)

## 2019-09-28 LAB — COMPREHENSIVE METABOLIC PANEL
ALT: 17 U/L (ref 0–44)
AST: 37 U/L (ref 15–41)
Albumin: 2 g/dL — ABNORMAL LOW (ref 3.5–5.0)
Alkaline Phosphatase: 56 U/L (ref 38–126)
Anion gap: 8 (ref 5–15)
BUN: 13 mg/dL (ref 6–20)
CO2: 24 mmol/L (ref 22–32)
Calcium: 8.1 mg/dL — ABNORMAL LOW (ref 8.9–10.3)
Chloride: 111 mmol/L (ref 98–111)
Creatinine, Ser: 0.82 mg/dL (ref 0.44–1.00)
GFR calc Af Amer: >60 mL/min (ref >60)
GFR calc non Af Amer: >60 mL/min (ref >60)
Glucose, Bld: 89 mg/dL (ref 70–99)
Potassium: 3.5 mmol/L (ref 3.5–5.1)
Sodium: 143 mmol/L (ref 135–145)
Total Bilirubin: 1.5 mg/dL — ABNORMAL HIGH (ref 0.3–1.2)
Total Protein: 5.3 g/dL — ABNORMAL LOW (ref 6.5–8.1)

## 2019-09-28 LAB — MAGNESIUM: Magnesium: 1.9 mg/dL (ref 1.7–2.4)

## 2019-09-28 LAB — PATHOLOGIST SMEAR REVIEW

## 2019-09-28 MED ORDER — ZOLPIDEM TARTRATE 5 MG PO TABS
5.0000 mg | ORAL_TABLET | Freq: Every evening | ORAL | Status: DC | PRN
  Administered 2019-09-28 – 2019-09-30 (×3): 5 mg via ORAL
  Filled 2019-09-28 (×3): qty 1

## 2019-09-28 MED ORDER — NYSTATIN 100000 UNIT/GM EX POWD
Freq: Three times a day (TID) | CUTANEOUS | Status: DC
  Administered 2019-09-28 – 2019-10-03 (×14): via TOPICAL
  Filled 2019-09-28 (×4): qty 15

## 2019-09-28 NOTE — Progress Notes (Signed)
Pt denies scratching area to R. Hip/upper buttocks, scratch marks noted by myself and another RN, Efrain Sella. Appears to be blistering above area. Will continue to monitor.

## 2019-09-28 NOTE — Progress Notes (Signed)
Initial Nutrition Assessment  DOCUMENTATION CODES:      INTERVENTION:  Provided low sodium diet education    NUTRITION DIAGNOSIS:   Limited adherence to nutrition-related recommendations related to limited prior education as evidenced by per patient/family report.   GOAL: diet/fluid compliance   MONITOR:   I & O's, Weight trends, PO intake, Labs  REASON FOR ASSESSMENT:   Consult Diet education  ASSESSMENT: Patient is a 63 yo female with history of morbid obesity, NASH cirrhosis and COPD. Tobacco smoker. BLE edema.  Home diet is regular. Excellent appetite. Meal intake 100%. Patient says she knows some things to do related to limiting sodium such as eating fresh vegetables. She has been making smoothies.   Provided Low Sodium Nutrition Therapy and Cirrhosis Handouts. Focused specially on identifying high sodium, processed foods and quality plant and lean protein choices. Discussed ways to eat healthfully on a budget. Addressed pt questions. Provided contact information.    NUTRITION - FOCUSED PHYSICAL EXAM: deferred   Diet Order:   Diet Order            Diet 2 gram sodium Room service appropriate? Yes; Fluid consistency: Thin; Fluid restriction: 1500 mL Fluid  Diet effective now              EDUCATION NEEDS: addressed    Skin:  Skin Assessment: Reviewed RN Assessment  Last BM:  10/21  Height:   Ht Readings from Last 1 Encounters:  09/27/19 5\' 7"  (1.702 m)    Weight:   Wt Readings from Last 1 Encounters:  09/28/19 (!) 197.5 kg    Ideal Body Weight:  61 kg  BMI:  Body mass index is 68.19 kg/m.  Estimated Nutritional Needs:   Kcal:  1800-2000  Protein:  122-148 gr  Fluid:  1500 ml daily per fluid restriction   Colman Cater MS,RD,CSG,LDN Office: [phone] Pager: [phone]

## 2019-09-28 NOTE — Progress Notes (Signed)
Subjective: Feels swelling is improved. Sat in chair with legs dangling and had LE worsening edema. Has had some muscle cramping including abdominal wall. Denies other abdominal pain, N/V, hematochezia, melena. Is urinating a lot. Previously was doing well with smoothies and low salt, but that got too expensive. Doesn't add salt to food, but does consume a lot of processed foods. No other GI complaints. Is amendable to dietician consult for low salt diet assistance.  Objective: Vital signs in last 24 hours: Temp:  [98.9 F (37.2 C)-99.5 F (37.5 C)] 98.9 F (37.2 C) (10/23 0404) Pulse Rate:  [89-93] 93 (10/23 0404) Resp:  [20] 20 (10/23 0404) BP: (84-129)/(50-83) 120/83 (10/23 0404) SpO2:  [93 %-99 %] 96 % (10/23 0404) Weight:  [196.7 kg-197.5 kg] 197.5 kg (10/23 0404) Last BM Date: 09/26/19 General:   Morbidly obese female. Alert and oriented, pleasant Eyes:  No icterus, sclera clear. Conjuctiva pink.  Heart:  S1, S2 present, no murmurs noted.  Lungs: Clear to auscultation bilaterally, without wheezing, rales, or rhonchi.  Abdomen:  Bowel sounds present, soft, non-tender, non-distended. No HSM or hernias noted. No rebound or guarding. No masses appreciated  Msk:  Symmetrical without gross deformities. Pulses:  Normal bilateral DP pulses noted. Extremities:  Without. 1+ pittine edema bilateral LE with minimally pitting edema around the knees. Non-pitting edema lower abdomen. Neurologic:  Alert and  oriented x4 Cervical Nodes:  No significant cervical adenopathy. Psych:  Alert and cooperative. Normal mood and affect.  Intake/Output from previous day: 10/22 0701 - 10/23 0700 In: -  Out: 900 [Urine:900] Intake/Output this shift: Total I/O In: 240 [P.O.:240] Out: -   Lab Results: Recent Labs    09/26/19 2115 09/27/19 0401 09/28/19 0507  WBC 3.3* 3.6* 3.2*  HGB 14.2 13.0 11.9*  HCT 45.2 40.5 38.0  PLT 98* 92* 92*   BMET Recent Labs    09/26/19 2115 09/27/19 0401  09/28/19 0507  NA 141 144 143  K 3.3* 3.7 3.5  CL 110 112* 111  CO2 24 23 24   GLUCOSE 94 90 89  BUN 13 12 13   CREATININE 0.75 0.71 0.82  CALCIUM 8.4* 8.4* 8.1*   LFT Recent Labs    09/26/19 2115 09/27/19 0401 09/28/19 0507  PROT 6.7 5.8* 5.3*  ALBUMIN 2.4* 2.2* 2.0*  AST 42* 40 37  ALT 23 21 17   ALKPHOS 74 62 56  BILITOT 1.1 1.3* 1.5*   PT/INR Recent Labs    09/27/19 0401  LABPROT 13.9  INR 1.1   Hepatitis Panel No results for input(s): HEPBSAG, HCVAB, HEPAIGM, HEPBIGM in the last 72 hours.   Studies/Results: Ct Abdomen Pelvis W Contrast  Result Date: 09/26/2019 CLINICAL DATA:  Abdomen distension EXAM: CT ABDOMEN AND PELVIS WITH CONTRAST TECHNIQUE: Multidetector CT imaging of the abdomen and pelvis was performed using the standard protocol following bolus administration of intravenous contrast. CONTRAST:  176mL OMNIPAQUE IOHEXOL 300 MG/ML  SOLN COMPARISON:  CT 07/28/2015 FINDINGS: Lower chest: Lung bases demonstrate no acute consolidation or pleural effusion. The heart size is within normal limits. Mitral calcification. Hepatobiliary: Lobulated liver contour consistent with cirrhosis. No focal hepatic abnormality. Multiple stones are hyperdense sludge in the gallbladder. No biliary dilatation Pancreas: Atrophic.  No inflammatory change Spleen: Enlarged measuring 17 cm craniocaudad. Adrenals/Urinary Tract: Adrenal glands are unremarkable. Kidneys are normal, without renal calculi, focal lesion, or hydronephrosis. Bladder is unremarkable. Stomach/Bowel: Stomach is within normal limits. Appendix not well seen. No evidence of bowel wall thickening, distention, or inflammatory  changes. Vascular/Lymphatic: Moderate aortic atherosclerosis. No aneurysm. Subcentimeter upper abdominal and retroperitoneal nodes. Small distal esophageal and perigastric varices. Recanalized paraumbilical vein. Reproductive: Uterus and bilateral adnexa are unremarkable. Other: No free air. Moderate to large  volume of ascites. Edema within the subcutaneous fat of the body wall. Musculoskeletal: No acute or suspicious osseous abnormality. IMPRESSION: 1. Hepatic cirrhosis with portal hypertension as evidenced by splenomegaly and upper abdominal varices. 2. Moderate to large volume of ascites in the abdomen 3. Gallstones are hyperdense sludge in the gallbladder Electronically Signed   By: Jasmine Pang M.D.   On: 09/26/2019 22:48   US Paracentesis  Result Date: 09/27/2019 INDICATION: Ascites; distension EXAM: ULTRASOUND GUIDED LLQ PARACENTESIS MEDICATIONS: 10 cc 1% lidocaine COMPLICATIONS: None immediate. PROCEDURE: Informed written consent was obtained from the patient after a discussion of the risks, benefits and alternatives to treatment. A timeout was performed prior to the initiation of the procedure. Initial ultrasound scanning demonstrates a large amount of ascites within the left lower abdominal quadrant. The left lower abdomen was prepped and draped in the usual sterile fashion. 1% lidocaine was used for local anesthesia. Following this, a 15 cm 19 g Yueh catheter was introduced. An ultrasound image was saved for documentation purposes. The paracentesis was performed. The catheter was removed and a dressing was applied. The patient tolerated the procedure well without immediate post procedural complication. FINDINGS: A total of approximately 4 liters of yellow fluid was removed. Samples were sent to the laboratory as requested by the clinical team. IMPRESSION: Successful ultrasound-guided paracentesis yielding 4 liters of peritoneal fluid. Read by Robet Leu [facility] Electronically Signed   By: Ulyses Southward M.D.   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 Teena Dunk) presented with decompensation with noted anasarca. She has had anasarca several years back but reports she was managing up until a couple of months ago. She has  not seen her PCP or GI in several years. Denies h/o heart disease which could be contributing to her edema. She has significant hypoalbuminemia. Current MELD Na of 8.   Anasarca: Continues with IV Lasix 60 mg bid and 100 mg daily po aldactone. Strict I&O ordered. Renal function normal/stable today with Cr 0.82, BUN 13. Calcium low at 8.1, other electrolytes essentially normal. Per flowsheet, no oral volume intake in the past 24 hours other than 240 mL 0900 today. Total urine output past 24 hours: 2400 mL and 1 occurrence not measured. Essentially -2160 mL net. Weight is up 1 kg, but unsure about reliability.   Plan: 1. Continue diruetics 2. STRICT I&O 3. Daily weights 4. Supportive measures 5. Dietician consult   Thank you for allowing [place] to participate in the care of Alexis Blankenship  Wynne Dust, DNP, AGNP-C Adult & Gerontological Nurse Practitioner [facility]     LOS: 1 day    09/28/2019, 11:29 AM

## 2019-09-28 NOTE — Progress Notes (Signed)
PROGRESS NOTE    Alexis Blankenship  DXI:[id number] DOB: 06/12/1956 DOA: 09/26/2019 PCP: [facility]   Brief Narrative:  Per HPI: Alexis Blankenship a 63 y.o.femalewith medical history significant ofosteoarthritis of the knees, COPD, morbid obesity, liver cirrhosis due to Karlene Lineman who is coming to the emergency department due to progressively worse edema for the past month or so. She states she has not taken her medications in several months due to lack of health care insurance. She complains of dyspnea on exertion and orthopnea. She denies fever, chills, sore throat, rhinorrhea, chest pain, palpitations, diaphoresis, nausea, emesis, diarrhea, melena or hematochezia. She gets constipation occasionally. No dysuria, frequency or hematuria. Denies polyuria, polydipsia, polyphagia or blurred vision.  10/22: Patient was admitted with anasarca secondary to poor outpatient follow-up and lack of medications over the last several months in the setting of history of NASH liver cirrhosis.  She has been started on IV Lasix 60 mg twice daily.  She has undergone paracentesis with 4 L of fluid removed.  GI consultation ordered and pending.  10/23: Patient appears to be clinically doing better after paracentesis yesterday.  She continues to diurese quite well, however urine outputs are inaccurately recorded on account of the fact that she has had a lot of urination on her bed.  She has been started on spironolactone by GI yesterday.  Assessment & Plan:   Principal Problem:   Anasarca Active Problems:   Liver cirrhosis secondary to NASH (HCC)   Morbid obesity (HCC)   COPD (chronic obstructive pulmonary disease) (HCC)   Hypokalemia   Anasarca secondary to Karlene Lineman liver cirrhosis -Appreciate GI consultation -Patient is status post paracentesis of 4 L of fluid -Continue on fluid and sodium restriction -Continue Lasix 60 mg IV twice daily -Monitor daily weights and strict I's and O's  -Patient has had poor follow-up and lack of medications -Albumin 2.2  Thrombocytopenia-stable -Secondary to cirrhosis -Monitor CBC -Avoid heparin agents  Morbid obesity -Lifestyle interventions  COPD with tobacco abuse -Counseled on smoking cessation  Hypokalemia -Currently supplemented -We will continue supplementation with aggressive IV Lasix -Check magnesium in a.m. -Monitor a.m. labs  DVT prophylaxis: SCDs Code Status: Full Family Communication: None at bedside, patient does have a son Disposition Plan: Continue IV diuresis.  Appreciate ongoing GI evaluation.  Fluid analysis from paracentesis.   Consultants:   GI  Procedures:   Paracentesis of 4 L of fluid on 10/22  Antimicrobials:   None  Subjective: Patient seen and evaluated today with no new acute complaints or concerns. No acute concerns or events noted overnight.  She is feeling more comfortable and would like to sit up in chair and ambulate if possible.  Objective: Vitals:   09/27/19 2012 09/27/19 2123 09/27/19 2140 09/28/19 0404  BP:  (!) 88/56 (!) 129/59 120/83  Pulse:  91  93  Resp:  20  20  Temp:  99.1 F (37.3 C)  98.9 F (37.2 C)  TempSrc:  Oral  Oral  SpO2: 93% 99%  96%  Weight:    (!) 197.5 kg  Height:        Intake/Output Summary (Last 24 hours) at 09/28/2019 0906 Last data filed at 09/28/2019 0300 Gross per 24 hour  Intake -  Output 900 ml  Net -900 ml   Filed Weights   09/26/19 1732 09/27/19 1708 09/28/19 0404  Weight: (!) 157.9 kg (!) 196.7 kg (!) 197.5 kg    Examination:  General exam: Appears calm and comfortable, obese  and volume overloaded Respiratory system: Clear to auscultation. Respiratory effort normal.  Currently on room air. Cardiovascular system: S1 & S2 heard, RRR. No JVD, murmurs, rubs, gallops or clicks. No pedal edema. Gastrointestinal system: Abdomen is minimally distended, soft and nontender. No organomegaly or masses felt. Normal bowel sounds  heard. Central nervous system: Alert and oriented. No focal neurological deficits. Extremities: Symmetric 5 x 5 power. Skin: No rashes, lesions or ulcers Psychiatry: Judgement and insight appear normal. Mood & affect appropriate.     Data Reviewed: I have personally reviewed following labs and imaging studies  CBC: Recent Labs  Lab 09/26/19 2115 09/27/19 0401 09/28/19 0507  WBC 3.3* 3.6* 3.2*  NEUTROABS 1.9  --   --   HGB 14.2 13.0 11.9*  HCT 45.2 40.5 38.0  MCV 104.6* 103.6* 103.8*  PLT 98* 92* 92*   Basic Metabolic Panel: Recent Labs  Lab 09/26/19 2115 09/27/19 0401 09/28/19 0507  NA 141 144 143  K 3.3* 3.7 3.5  CL 110 112* 111  CO2 24 23 24   GLUCOSE 94 90 89  BUN 13 12 13   CREATININE 0.75 0.71 0.82  CALCIUM 8.4* 8.4* 8.1*  MG 2.1  --  1.9  PHOS 3.3  --   --    GFR: Estimated Creatinine Clearance: 133.6 mL/min (by C-G formula based on SCr of 0.82 mg/dL). Liver Function Tests: Recent Labs  Lab 09/26/19 2115 09/27/19 0401 09/28/19 0507  AST 42* 40 37  ALT 23 21 17   ALKPHOS 74 62 56  BILITOT 1.1 1.3* 1.5*  PROT 6.7 5.8* 5.3*  ALBUMIN 2.4* 2.2* 2.0*   Recent Labs  Lab 09/26/19 2115  LIPASE 24   No results for input(s): AMMONIA in the last 168 hours. Coagulation Profile: Recent Labs  Lab 09/27/19 0401  INR 1.1   Cardiac Enzymes: No results for input(s): CKTOTAL, CKMB, CKMBINDEX, TROPONINI in the last 168 hours. BNP (last 3 results) No results for input(s): PROBNP in the last 8760 hours. HbA1C: No results for input(s): HGBA1C in the last 72 hours. CBG: No results for input(s): GLUCAP in the last 168 hours. Lipid Profile: No results for input(s): CHOL, HDL, LDLCALC, TRIG, CHOLHDL, LDLDIRECT in the last 72 hours. Thyroid Function Tests: No results for input(s): TSH, T4TOTAL, FREET4, T3FREE, THYROIDAB in the last 72 hours. Anemia Panel: No results for input(s): VITAMINB12, FOLATE, FERRITIN, TIBC, IRON, RETICCTPCT in the last 72 hours. Sepsis  Labs: No results for input(s): PROCALCITON, LATICACIDVEN in the last 168 hours.  Recent Results (from the past 240 hour(s))  SARS CORONAVIRUS 2 (TAT 6-24 HRS) Nasopharyngeal Nasopharyngeal Swab     Status: None   Collection Time: 09/27/19  1:16 AM   Specimen: Nasopharyngeal Swab  Result Value Ref Range Status   SARS Coronavirus 2 NEGATIVE NEGATIVE Final    Comment: (NOTE) SARS-CoV-2 target nucleic acids are NOT DETECTED. The SARS-CoV-2 RNA is generally detectable in upper and lower respiratory specimens during the acute phase of infection. Negative results do not preclude SARS-CoV-2 infection, do not rule out co-infections with other pathogens, and should not be used as the sole basis for treatment or other patient management decisions. Negative results must be combined with clinical observations, patient history, and epidemiological information. The expected result is Negative. Fact Sheet for Patients: 2116 Fact Sheet for Healthcare Providers: 09/29/19 This test is not yet approved or cleared by the 09/29/19 FDA and  has been authorized for detection and/or diagnosis of SARS-CoV-2 by FDA under an Emergency Use Authorization (  EUA). This EUA will remain  in effect (meaning this test can be used) for the duration of the COVID-19 declaration under Section 56 4(b)(1) of the Act, 21 U.S.C. section 360bbb-3(b)(1), unless the authorization is terminated or revoked sooner. Performed at [facility], [address], [place], [place] [address]   Culture, body fluid-bottle     Status: None (Preliminary result)   Collection Time: 09/27/19  9:45 AM   Specimen: Ascitic  Result Value Ref Range Status   Specimen Description ASCITIC  Final   Special Requests BOTTLES DRAWN AEROBIC AND ANAEROBIC 10CC  Final   Culture   Final    NO GROWTH < 24 HOURS Performed at [facility], [address] [place], [place]  [address]    Report Status PENDING  Incomplete  Gram stain     Status: None   Collection Time: 09/27/19  9:52 AM   Specimen: Ascitic; Body Fluid  Result Value Ref Range Status   Specimen Description ASCITIC  Final   Special Requests NONE  Final   Gram Stain   Final    NO ORGANISMS SEEN WBC PRESENT, PREDOMINANTLY MONONUCLEAR CYTOSPIN SMEAR Performed at [facility], [address], [place], [place] [address]    Report Status 09/27/2019 FINAL  Final         Radiology Studies: Ct Abdomen Pelvis W Contrast  Result Date: 09/26/2019 CLINICAL DATA:  Abdomen distension EXAM: CT ABDOMEN AND PELVIS WITH CONTRAST TECHNIQUE: Multidetector CT imaging of the abdomen and pelvis was performed using the standard protocol following bolus administration of intravenous contrast. CONTRAST:  OMNIPAQUE IOHEXOL 300 MG/ML  SOLN COMPARISON:  CT 07/28/2015 FINDINGS: Lower chest: Lung bases demonstrate no acute consolidation or pleural effusion. The heart size is within normal limits. Mitral calcification. Hepatobiliary: Lobulated liver contour consistent with cirrhosis. No focal hepatic abnormality. Multiple stones are hyperdense sludge in the gallbladder. No biliary dilatation Pancreas: Atrophic.  No inflammatory change Spleen: Enlarged measuring 17 cm craniocaudad. Adrenals/Urinary Tract: Adrenal glands are unremarkable. Kidneys are normal, without renal calculi, focal lesion, or hydronephrosis. Bladder is unremarkable. Stomach/Bowel: Stomach is within normal limits. Appendix not well seen. No evidence of bowel wall thickening, distention, or inflammatory changes. Vascular/Lymphatic: Moderate aortic atherosclerosis. No aneurysm. Subcentimeter upper abdominal and retroperitoneal nodes. Small distal esophageal and perigastric varices. Recanalized paraumbilical vein. Reproductive: Uterus and bilateral adnexa are unremarkable. Other: No free air. Moderate to large volume of ascites. Edema within the subcutaneous fat  of the body wall. Musculoskeletal: No acute or suspicious osseous abnormality. IMPRESSION: 1. Hepatic cirrhosis with portal hypertension as evidenced by splenomegaly and upper abdominal varices. 2. Moderate to large volume of ascites in the abdomen 3. Gallstones are hyperdense sludge in the gallbladder Electronically Signed   By: Jasmine Pang M.D.   On: 09/26/2019 22:48   US Paracentesis  Result Date: 09/27/2019 INDICATION: Ascites; distension EXAM: ULTRASOUND GUIDED LLQ PARACENTESIS MEDICATIONS: 10 cc 1% lidocaine COMPLICATIONS: None immediate. PROCEDURE: Informed written consent was obtained from the patient after a discussion of the risks, benefits and alternatives to treatment. A timeout was performed prior to the initiation of the procedure. Initial ultrasound scanning demonstrates a large amount of ascites within the left lower abdominal quadrant. The left lower abdomen was prepped and draped in the usual sterile fashion. 1% lidocaine was used for local anesthesia. Following this, a 15 cm 19 g Yueh catheter was introduced. An ultrasound image was saved for documentation purposes. The paracentesis was performed. The catheter was removed and a dressing  was applied. The patient tolerated the procedure well without immediate post procedural complication. FINDINGS: A total of approximately 4 liters of yellow fluid was removed. Samples were sent to the laboratory as requested by the clinical team. IMPRESSION: Successful ultrasound-guided paracentesis yielding 4 liters of peritoneal fluid. Read by Robet LeuPamela A [facility] Electronically Signed   By: Ulyses SouthwardMark  Boles M.D.   On: 09/27/2019 10:12        Scheduled Meds: . furosemide  60 mg Intravenous BID  . influenza vac split quadrivalent PF  0.5 mL Intramuscular Tomorrow-1000  . potassium chloride  20 mEq Oral BID  . spironolactone  100 mg Oral Daily   Continuous Infusions:   LOS: 1 day    Time spent: 30 minutes    Karthik Whittinghill Hoover BrunetteD Aaren Krog, DO [facility] Pager [phone]  If 7PM-7AM, please contact night-coverage [email or web address] Password [email or web address] 09/28/2019, 9:06 AM

## 2019-09-29 ENCOUNTER — Other Ambulatory Visit: Payer: Self-pay

## 2019-09-29 DIAGNOSIS — R188 Other ascites: Secondary | ICD-10-CM

## 2019-09-29 DIAGNOSIS — R601 Generalized edema: Secondary | ICD-10-CM

## 2019-09-29 DIAGNOSIS — E876 Hypokalemia: Secondary | ICD-10-CM

## 2019-09-29 DIAGNOSIS — K7460 Unspecified cirrhosis of liver: Secondary | ICD-10-CM

## 2019-09-29 DIAGNOSIS — K7581 Nonalcoholic steatohepatitis (NASH): Secondary | ICD-10-CM

## 2019-09-29 LAB — COMPREHENSIVE METABOLIC PANEL
ALT: 20 U/L (ref 0–44)
AST: 37 U/L (ref 15–41)
Albumin: 1.9 g/dL — ABNORMAL LOW (ref 3.5–5.0)
Alkaline Phosphatase: 50 U/L (ref 38–126)
Anion gap: 9 (ref 5–15)
BUN: 15 mg/dL (ref 6–20)
CO2: 25 mmol/L (ref 22–32)
Calcium: 8 mg/dL — ABNORMAL LOW (ref 8.9–10.3)
Chloride: 109 mmol/L (ref 98–111)
Creatinine, Ser: 0.75 mg/dL (ref 0.44–1.00)
GFR calc Af Amer: >60 mL/min (ref >60)
GFR calc non Af Amer: >60 mL/min (ref >60)
Glucose, Bld: 83 mg/dL (ref 70–99)
Potassium: 3.6 mmol/L (ref 3.5–5.1)
Sodium: 143 mmol/L (ref 135–145)
Total Bilirubin: 1 mg/dL (ref 0.3–1.2)
Total Protein: 5.1 g/dL — ABNORMAL LOW (ref 6.5–8.1)

## 2019-09-29 LAB — CBC
HCT: 36.4 % (ref 36.0–46.0)
Hemoglobin: 11.9 g/dL — ABNORMAL LOW (ref 12.0–15.0)
MCH: 33.5 pg (ref 26.0–34.0)
MCHC: 32.7 g/dL (ref 30.0–36.0)
MCV: 102.5 fL — ABNORMAL HIGH (ref 80.0–100.0)
Platelets: 84 10*3/uL — ABNORMAL LOW (ref 150–400)
RBC: 3.55 MIL/uL — ABNORMAL LOW (ref 3.87–5.11)
RDW: 14.6 % (ref 11.5–15.5)
WBC: 3.2 10*3/uL — ABNORMAL LOW (ref 4.0–10.5)
nRBC: 0 % (ref 0.0–0.2)

## 2019-09-29 LAB — MAGNESIUM: Magnesium: 1.8 mg/dL (ref 1.7–2.4)

## 2019-09-29 MED ORDER — CHLORHEXIDINE GLUCONATE CLOTH 2 % EX PADS
6.0000 | MEDICATED_PAD | Freq: Every day | CUTANEOUS | Status: DC
  Administered 2019-09-30 – 2019-10-03 (×4): 6 via TOPICAL

## 2019-09-29 NOTE — Progress Notes (Signed)
PROGRESS NOTE    Alexis Blankenship  JTT:[id number] DOB: 15-Feb-1956 DOA: 09/26/2019 PCP: [facility]   Brief Narrative:  Per HPI: Alexis Blankenship a 63 y.o.femalewith medical history significant ofosteoarthritis of the knees, COPD, morbid obesity, liver cirrhosis due to Karlene Lineman who is coming to the emergency department due to progressively worse edema for the past month or so. She states she has not taken her medications in several months due to lack of health care insurance. She complains of dyspnea on exertion and orthopnea. She denies fever, chills, sore throat, rhinorrhea, chest pain, palpitations, diaphoresis, nausea, emesis, diarrhea, melena or hematochezia. She gets constipation occasionally. No dysuria, frequency or hematuria. Denies polyuria, polydipsia, polyphagia or blurred vision.  10/22: Patient was admitted with anasarca secondary to poor outpatient follow-up and lack of medications over the last several months in the setting of history ofNASHliver cirrhosis.She has been started on IV Lasix 60 mg twice daily. She has undergone paracentesis with 4 L of fluid removed. GI consultation ordered and pending.  10/23: Patient appears to be clinically doing better after paracentesis yesterday.  She continues to diurese quite well, however urine outputs are inaccurately recorded on account of the fact that she has had a lot of urination on her bed.  She has been started on spironolactone by GI yesterday.  10/24: Patient overall doing better and appears to be diuresing, but accurate I's and O's cannot be documented on account of the fact that she tends to urinate in the bed and not make it to the commode.  We will try Foley catheter for more accurate measurement today.  Dietitian consultation pending.  Appreciate ongoing GI evaluation.  Assessment & Plan:   Principal Problem:   Anasarca Active Problems:   Liver cirrhosis secondary to NASH (HCC)   Morbid  obesity (HCC)   COPD (chronic obstructive pulmonary disease) (HCC)   Hypokalemia   Ascites   Anasarca secondary to Karlene Lineman liver cirrhosis -Appreciate GI consultation, with poor diet noted at home -Dietitian consultation appreciated -Patient is status post paracentesis of 4 L of fluid -Continue on fluid and sodium restriction -Continue Lasix 60 mg IV twice daily -Monitor daily weights and strict I's and O's, now with Foley catheter insertion to assist with measurement -Patient has had poor follow-up and lack of medications -Albumin 2.2  Thrombocytopenia-stable -Secondary to cirrhosis -Monitor CBC -Avoid heparin agents  Morbid obesity -Lifestyle interventions  COPD with tobacco abuse -Counseled on smoking cessation  Hypokalemia-resolved -Currently supplemented -We will continue supplementation with aggressive IV Lasix -Check magnesium in a.m. -Monitor a.m. labs  DVT prophylaxis:SCDs Code Status:Full Family Communication:None at bedside, patient does have a son Disposition Plan:Continue IV diuresis. Appreciate ongoing GI evaluation. Fluid analysis from paracentesis.  Dietitian education appreciated.  Closer monitoring with Foley catheter.   Consultants:  GI  Procedures:  Paracentesis of 4 L of fluid on 10/22  Antimicrobials:   None  Subjective: Patient seen and evaluated today with no new acute complaints or concerns. No acute concerns or events noted overnight.  She appears to be urinating frequently, however this cannot be monitored closely enough.  Objective: Vitals:   09/28/19 2116 09/29/19 0442 09/29/19 0534 09/29/19 0840  BP: 117/70 (!) 98/48 100/63   Pulse: 83 90 88   Resp: 20 19    Temp: 98.1 F (36.7 C) 98.2 F (36.8 C) 98.1 F (36.7 C)   TempSrc: Oral Oral    SpO2: 99% 94%  95%  Weight:   (!) 196.7 kg  Height:        Intake/Output Summary (Last 24 hours) at 09/29/2019 1253 Last data filed at 09/29/2019 0900 Gross per 24  hour  Intake 1080 ml  Output 1000 ml  Net 80 ml   Filed Weights   09/27/19 1708 09/28/19 0404 09/29/19 0534  Weight: (!) 196.7 kg (!) 197.5 kg (!) 196.7 kg    Examination:  General exam: Appears calm and comfortable, obese with anasarca Respiratory system: Clear to auscultation. Respiratory effort normal. Cardiovascular system: S1 & S2 heard, RRR. No JVD, murmurs, rubs, gallops or clicks. No pedal edema. Gastrointestinal system: Abdomen is nondistended, soft and nontender. No organomegaly or masses felt. Normal bowel sounds heard. Central nervous system: Alert and oriented. No focal neurological deficits. Extremities: Symmetric 5 x 5 power. Skin: No rashes, lesions or ulcers Psychiatry: Judgement and insight appear normal. Mood & affect appropriate.     Data Reviewed: I have personally reviewed following labs and imaging studies  CBC: Recent Labs  Lab 09/26/19 2115 09/27/19 0401 09/28/19 0507 09/29/19 0626  WBC 3.3* 3.6* 3.2* 3.2*  NEUTROABS 1.9  --   --   --   HGB 14.2 13.0 11.9* 11.9*  HCT 45.2 40.5 38.0 36.4  MCV 104.6* 103.6* 103.8* 102.5*  PLT 98* 92* 92* 84*   Basic Metabolic Panel: Recent Labs  Lab 09/26/19 2115 09/27/19 0401 09/28/19 0507 09/29/19 0626  NA 141 144 143 143  K 3.3* 3.7 3.5 3.6  CL 110 112* 111 109  CO2 24 23 24 25   GLUCOSE 94 90 89 83  BUN 13 12 13 15   CREATININE 0.75 0.71 0.82 0.75  CALCIUM 8.4* 8.4* 8.1* 8.0*  MG 2.1  --  1.9 1.8  PHOS 3.3  --   --   --    GFR: Estimated Creatinine Clearance: 136.5 mL/min (by C-G formula based on SCr of 0.75 mg/dL). Liver Function Tests: Recent Labs  Lab 09/26/19 2115 09/27/19 0401 09/28/19 0507 09/29/19 0626  AST 42* 40 37 37  ALT 23 21 17 20   ALKPHOS 74 62 56 50  BILITOT 1.1 1.3* 1.5* 1.0  PROT 6.7 5.8* 5.3* 5.1*  ALBUMIN 2.4* 2.2* 2.0* 1.9*   Recent Labs  Lab 09/26/19 2115  LIPASE 24   No results for input(s): AMMONIA in the last 168 hours. Coagulation Profile: Recent Labs   Lab 09/27/19 0401  INR 1.1   Cardiac Enzymes: No results for input(s): CKTOTAL, CKMB, CKMBINDEX, TROPONINI in the last 168 hours. BNP (last 3 results) No results for input(s): PROBNP in the last 8760 hours. HbA1C: No results for input(s): HGBA1C in the last 72 hours. CBG: No results for input(s): GLUCAP in the last 168 hours. Lipid Profile: No results for input(s): CHOL, HDL, LDLCALC, TRIG, CHOLHDL, LDLDIRECT in the last 72 hours. Thyroid Function Tests: No results for input(s): TSH, T4TOTAL, FREET4, T3FREE, THYROIDAB in the last 72 hours. Anemia Panel: No results for input(s): VITAMINB12, FOLATE, FERRITIN, TIBC, IRON, RETICCTPCT in the last 72 hours. Sepsis Labs: No results for input(s): PROCALCITON, LATICACIDVEN in the last 168 hours.  Recent Results (from the past 240 hour(s))  SARS CORONAVIRUS 2 (TAT 6-24 HRS) Nasopharyngeal Nasopharyngeal Swab     Status: None   Collection Time: 09/27/19  1:16 AM   Specimen: Nasopharyngeal Swab  Result Value Ref Range Status   SARS Coronavirus 2 NEGATIVE NEGATIVE Final    Comment: (NOTE) SARS-CoV-2 target nucleic acids are NOT DETECTED. The SARS-CoV-2 RNA is generally detectable in upper and lower respiratory  specimens during the acute phase of infection. Negative results do not preclude SARS-CoV-2 infection, do not rule out co-infections with other pathogens, and should not be used as the sole basis for treatment or other patient management decisions. Negative results must be combined with clinical observations, patient history, and epidemiological information. The expected result is Negative. Fact Sheet for Patients: HairSlick.nohttps://www.fda.gov/media/138098/download Fact Sheet for Healthcare Providers: quierodirigir.comhttps://www.fda.gov/media/138095/download This test is not yet approved or cleared by the [place] FDA and  has been authorized for detection and/or diagnosis of SARS-CoV-2 by FDA under an Emergency Use Authorization (EUA). This EUA will  remain  in effect (meaning this test can be used) for the duration of the COVID-19 declaration under Section 56 4(b)(1) of the Act, 21 U.S.C. section 360bbb-3(b)(1), unless the authorization is terminated or revoked sooner. Performed at [facility], [address] [place], [place] [address]   Culture, body fluid-bottle     Status: None (Preliminary result)   Collection Time: 09/27/19  9:45 AM   Specimen: Ascitic  Result Value Ref Range Status   Specimen Description ASCITIC  Final   Special Requests BOTTLES DRAWN AEROBIC AND ANAEROBIC 10CC  Final   Culture   Final    NO GROWTH 2 DAYS Performed at [facility], [address] [place], [place] [address]    Report Status PENDING  Incomplete  Gram stain     Status: None   Collection Time: 09/27/19  9:52 AM   Specimen: Ascitic; Body Fluid  Result Value Ref Range Status   Specimen Description ASCITIC  Final   Special Requests NONE  Final   Gram Stain   Final    NO ORGANISMS SEEN WBC PRESENT, PREDOMINANTLY MONONUCLEAR CYTOSPIN SMEAR Performed at [facility], [address] [place], [place] [address]    Report Status 09/27/2019 FINAL  Final         Radiology Studies: No results found.      Scheduled Meds: . furosemide  60 mg Intravenous BID  . influenza vac split quadrivalent PF  0.5 mL Intramuscular Tomorrow-1000  . nystatin   Topical TID  . potassium chloride  20 mEq Oral BID  . spironolactone  100 mg Oral Daily   Continuous Infusions:   LOS: 2 days    Time spent: 30 minutes    Anjelo Pullman Hoover BrunetteD Orland Visconti, DO [facility] Pager [phone]  If 7PM-7AM, please contact night-coverage [email or web address] Password [email or web address] 09/29/2019, 12:53 PM

## 2019-09-29 NOTE — Progress Notes (Signed)
Subjective:  Patient has no complaints.  She states she feels so much better.  She has decided that she would not let this happen to her again.  She will stick with low-salt diet.  She also needs to go back on a diet in order to lose weight.  She states back in 1989 she weighed 900 pounds and managed to get down below 400.  She denies abdominal pain melena rectal bleeding nausea or vomiting.  She says she has at least 2-3 stools per day.  She has a bowel movement most of the time when she urinates.  Objective: Blood pressure 118/71, pulse 91, temperature 98.2 F (36.8 C), resp. rate 19, height 5' 7"  (1.702 m), weight (!) 196.7 kg, SpO2 95 %. Patient is alert and does not have asterixis. Conjunctiva is pink. Sclera is nonicteric Oropharyngeal mucosa is normal. No neck masses or thyromegaly noted. Cardiac exam with regular rhythm normal S1 and S2. No murmur or gallop noted. Lungs are clear to auscultation. Abdomen is very large but soft and nontender.  She does have some pitting edema to lower abdominal wall. He has at least 2+ pitting edema involving both legs.  She has dressing covering a blister over right leg.  Urine output recorded to be 1000 mL last 24 hours.  Labs/studies Results:  CBC Latest Ref Rng & Units 09/29/2019 09/28/2019 09/27/2019  WBC 4.0 - 10.5 K/uL 3.2(L) 3.2(L) 3.6(L)  Hemoglobin 12.0 - 15.0 g/dL 11.9(L) 11.9(L) 13.0  Hematocrit 36.0 - 46.0 % 36.4 38.0 40.5  Platelets 150 - 400 K/uL 84(L) 92(L) 92(L)    CMP Latest Ref Rng & Units 09/29/2019 09/28/2019 09/27/2019  Glucose 70 - 99 mg/dL 83 89 90  BUN 6 - 20 mg/dL 15 13 12   Creatinine 0.44 - 1.00 mg/dL 0.75 0.82 0.71  Sodium 135 - 145 mmol/L 143 143 144  Potassium 3.5 - 5.1 mmol/L 3.6 3.5 3.7  Chloride 98 - 111 mmol/L 109 111 112(H)  CO2 22 - 32 mmol/L 25 24 23   Calcium 8.9 - 10.3 mg/dL 8.0(L) 8.1(L) 8.4(L)  Total Protein 6.5 - 8.1 g/dL 5.1(L) 5.3(L) 5.8(L)  Total Bilirubin 0.3 - 1.2 mg/dL 1.0 1.5(H) 1.3(H)   Alkaline Phos 38 - 126 U/L 50 56 62  AST 15 - 41 U/L 37 37 40  ALT 0 - 44 U/L 20 17 21     Hepatic Function Latest Ref Rng & Units 09/29/2019 09/28/2019 09/27/2019  Total Protein 6.5 - 8.1 g/dL 5.1(L) 5.3(L) 5.8(L)  Albumin 3.5 - 5.0 g/dL 1.9(L) 2.0(L) 2.2(L)  AST 15 - 41 U/L 37 37 40  ALT 0 - 44 U/L 20 17 21   Alk Phosphatase 38 - 126 U/L 50 56 62  Total Bilirubin 0.3 - 1.2 mg/dL 1.0 1.5(H) 1.3(H)      Assessment:  #1.  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  She has pancytopenia and she also presented with ascites and anasarca.  She had 4 L of fluid removed 2 days ago.  Study is negative for SBP.  She is responding to diuretic therapy as evidenced by urine output.  Need to obtain weight on sliding scale. Serum albumin is quite low.  She may benefit from albumin infusion.  . Patient has been evaluated by dietitian and appropriate dietary instructions provided to the patient.  She may benefit from outpatient visit as well in order to help her lose weight.  With underlying cirrhosis unfortunately she is not a candidate for bariatric surgery.  #2.  Morbid obesity.  Recommendations  Patient advised to get hepatitis a and B vaccination. Check weight on standing scale.

## 2019-09-30 DIAGNOSIS — R601 Generalized edema: Secondary | ICD-10-CM | POA: Diagnosis not present

## 2019-09-30 LAB — BASIC METABOLIC PANEL
Anion gap: 7 (ref 5–15)
BUN: 15 mg/dL (ref 6–20)
CO2: 25 mmol/L (ref 22–32)
Calcium: 7.8 mg/dL — ABNORMAL LOW (ref 8.9–10.3)
Chloride: 108 mmol/L (ref 98–111)
Creatinine, Ser: 0.76 mg/dL (ref 0.44–1.00)
GFR calc Af Amer: >60 mL/min (ref >60)
GFR calc non Af Amer: >60 mL/min (ref >60)
Glucose, Bld: 83 mg/dL (ref 70–99)
Potassium: 3.5 mmol/L (ref 3.5–5.1)
Sodium: 140 mmol/L (ref 135–145)

## 2019-09-30 LAB — MAGNESIUM: Magnesium: 1.9 mg/dL (ref 1.7–2.4)

## 2019-09-30 MED ORDER — ALBUMIN HUMAN 25 % IV SOLN
50.0000 g | Freq: Once | INTRAVENOUS | Status: AC
  Administered 2019-09-30: 12.5 g via INTRAVENOUS
  Filled 2019-09-30: qty 50

## 2019-09-30 NOTE — Progress Notes (Signed)
PROGRESS NOTE    Keonia Pasko  ZJQ:[id number] DOB: 1956/04/05 DOA: 09/26/2019 PCP: [facility]   Brief Narrative:  Per HPI: Alexis Blankenship a 63 y.o.femalewith medical history significant ofosteoarthritis of the knees, COPD, morbid obesity, liver cirrhosis due to Alexis Blankenship who is coming to the emergency department due to progressively worse edema for the past month or so. She states she has not taken her medications in several months due to lack of health care insurance. She complains of dyspnea on exertion and orthopnea. She denies fever, chills, sore throat, rhinorrhea, chest pain, palpitations, diaphoresis, nausea, emesis, diarrhea, melena or hematochezia. She gets constipation occasionally. No dysuria, frequency or hematuria. Denies polyuria, polydipsia, polyphagia or blurred vision.  10/22: Patient was admitted with anasarca secondary to poor outpatient follow-up and lack of medications over the last several months in the setting of history ofNASHliver cirrhosis.She has been started on IV Lasix 60 mg twice daily. She has undergone paracentesis with 4 L of fluid removed. GI consultation ordered and pending.  10/23:Patient appears to be clinically doing better after paracentesis yesterday. She continues to diurese quite well, however urine outputs are inaccurately recorded on account of the fact that she has had a lot of urination on her bed. She has been started on spironolactone by GI yesterday.  10/24: Patient overall doing better and appears to be diuresing, but accurate I's and O's cannot be documented on account of the fact that she tends to urinate in the bed and not make it to the commode.  We will try Foley catheter for more accurate measurement today.  Dietitian consultation pending.  Appreciate ongoing GI evaluation.  10/25: Patient continues to diurese quite well as evidenced on urine output.  Negative fluid balance of 2.8 L noted.  She  appears to still be diuresing quite well and will plan to remove Foley catheter today and follow labs to continue diuresis to renal tolerance.  She may require some IV albumin infusion, however her blood pressures are holding steady and she is diuresing well and will avoid albumin for now.  Appreciate GI evaluation.  No signs of SBP on paracentesis.  Patient is eager to go home.  Assessment & Plan:   Principal Problem:   Anasarca Active Problems:   Liver cirrhosis secondary to NASH (HCC)   Morbid obesity (Alexis Blankenship)   COPD (chronic obstructive pulmonary disease) (HCC)   Hypokalemia   Ascites   Anasarca secondary to Alexis Blankenship liver cirrhosis -Appreciate GI consultation, with poor diet noted at home -Dietitian consultation appreciated -Patient is status post paracentesis of 4 L of fluid -Continue on fluid and sodium restriction -Continue Lasix 60 mg IV twice daily with good diuresis noted. -Monitor daily weights and strict I's and O's, plan to DC Foley catheter today -Patient has had poor follow-up and lack of medications -Albumin 1.9, recheck in a.m. and will consider albumin infusions as needed for low blood pressure readings and poorly tolerated diuresis.  Thrombocytopenia-stable -Secondary to cirrhosis -Monitor CBC -Avoid heparin agents  Morbid obesity -Lifestyle interventions  COPD with tobacco abuse -Counseled on smoking cessation  Hypokalemia-resolved -Currently supplemented -We will continue supplementation with aggressive IV Lasix -Check magnesium in a.m. -Monitor a.m. labs  DVT prophylaxis:SCDs Code Status:Full Family Communication:None at bedside, patient does have a son Disposition Plan:Continue IV diuresis. AppreciateongoingGI evaluation.  Plan to discontinue Foley catheter today, but continue monitoring of I's and O's.  Albumin infusion as needed.   Consultants:  GI  Procedures:  Paracentesis of 4  L of fluid on 10/22  Antimicrobials:   None   Subjective: Patient seen and evaluated today with no new acute complaints or concerns. No acute concerns or events noted overnight.  She has been diuresing well and is having breakfast this morning.  She is eager to go home soon.  Objective: Vitals:   09/29/19 0840 09/29/19 1408 09/29/19 2116 09/30/19 0500  BP:  118/71 (!) 108/58 (!) 107/51  Pulse:  91 85 89  Resp:  19 20 20   Temp:  98.2 F (36.8 C) 98 F (36.7 C) (!) 97.5 F (36.4 C)  TempSrc:   Oral Oral  SpO2: 95% 95% 98% 96%  Weight:    (!) 190.3 kg  Height:        Intake/Output Summary (Last 24 hours) at 09/30/2019 1028 Last data filed at 09/30/2019 0500 Gross per 24 hour  Intake 720 ml  Output 3750 ml  Net -3030 ml   Filed Weights   09/28/19 0404 09/29/19 0534 09/30/19 0500  Weight: (!) 197.5 kg (!) 196.7 kg (!) 190.3 kg    Examination:  General exam: Appears calm and comfortable, obese with volume overload Respiratory system: Clear to auscultation. Respiratory effort normal. Cardiovascular system: S1 & S2 heard, RRR. No JVD, murmurs, rubs, gallops or clicks. No pedal edema. Gastrointestinal system: Abdomen is nondistended, soft and nontender. No organomegaly or masses felt. Normal bowel sounds heard. Central nervous system: Alert and oriented. No focal neurological deficits. Extremities: Symmetric 5 x 5 power. Skin: No rashes, lesions or ulcers Psychiatry: Judgement and insight appear normal. Mood & affect appropriate.     Data Reviewed: I have personally reviewed following labs and imaging studies  CBC: Recent Labs  Lab 09/26/19 2115 09/27/19 0401 09/28/19 0507 09/29/19 0626  WBC 3.3* 3.6* 3.2* 3.2*  NEUTROABS 1.9  --   --   --   HGB 14.2 13.0 11.9* 11.9*  HCT 45.2 40.5 38.0 36.4  MCV 104.6* 103.6* 103.8* 102.5*  PLT 98* 92* 92* 84*   Basic Metabolic Panel: Recent Labs  Lab 09/26/19 2115 09/27/19 0401 09/28/19 0507 09/29/19 0626 09/30/19 0617  NA 141 144 143 143 140  K 3.3* 3.7 3.5 3.6  3.5  CL 110 112* 111 109 108  CO2 24 23 24 25 25   GLUCOSE 94 90 89 83 83  BUN 13 12 13 15 15   CREATININE 0.75 0.71 0.82 0.75 0.76  CALCIUM 8.4* 8.4* 8.1* 8.0* 7.8*  MG 2.1  --  1.9 1.8 1.9  PHOS 3.3  --   --   --   --    GFR: Estimated Creatinine Clearance: 133.5 mL/min (by C-G formula based on SCr of 0.76 mg/dL). Liver Function Tests: Recent Labs  Lab 09/26/19 2115 09/27/19 0401 09/28/19 0507 09/29/19 0626  AST 42* 40 37 37  ALT 23 21 17 20   ALKPHOS 74 62 56 50  BILITOT 1.1 1.3* 1.5* 1.0  PROT 6.7 5.8* 5.3* 5.1*  ALBUMIN 2.4* 2.2* 2.0* 1.9*   Recent Labs  Lab 09/26/19 2115  LIPASE 24   No results for input(s): AMMONIA in the last 168 hours. Coagulation Profile: Recent Labs  Lab 09/27/19 0401  INR 1.1   Cardiac Enzymes: No results for input(s): CKTOTAL, CKMB, CKMBINDEX, TROPONINI in the last 168 hours. BNP (last 3 results) No results for input(s): PROBNP in the last 8760 hours. HbA1C: No results for input(s): HGBA1C in the last 72 hours. CBG: No results for input(s): GLUCAP in the last 168 hours. Lipid Profile:  No results for input(s): CHOL, HDL, LDLCALC, TRIG, CHOLHDL, LDLDIRECT in the last 72 hours. Thyroid Function Tests: No results for input(s): TSH, T4TOTAL, FREET4, T3FREE, THYROIDAB in the last 72 hours. Anemia Panel: No results for input(s): VITAMINB12, FOLATE, FERRITIN, TIBC, IRON, RETICCTPCT in the last 72 hours. Sepsis Labs: No results for input(s): PROCALCITON, LATICACIDVEN in the last 168 hours.  Recent Results (from the past 240 hour(s))  SARS CORONAVIRUS 2 (TAT 6-24 HRS) Nasopharyngeal Nasopharyngeal Swab     Status: None   Collection Time: 09/27/19  1:16 AM   Specimen: Nasopharyngeal Swab  Result Value Ref Range Status   SARS Coronavirus 2 NEGATIVE NEGATIVE Final    Comment: (NOTE) SARS-CoV-2 target nucleic acids are NOT DETECTED. The SARS-CoV-2 RNA is generally detectable in upper and lower respiratory specimens during the acute phase of  infection. Negative results do not preclude SARS-CoV-2 infection, do not rule out co-infections with other pathogens, and should not be used as the sole basis for treatment or other patient management decisions. Negative results must be combined with clinical observations, patient history, and epidemiological information. The expected result is Negative. Fact Sheet for Patients: HairSlick.no Fact Sheet for Healthcare Providers: quierodirigir.com This test is not yet approved or cleared by the [place] FDA and  has been authorized for detection and/or diagnosis of SARS-CoV-2 by FDA under an Emergency Use Authorization (EUA). This EUA will remain  in effect (meaning this test can be used) for the duration of the COVID-19 declaration under Section 56 4(b)(1) of the Act, 21 U.S.C. section 360bbb-3(b)(1), unless the authorization is terminated or revoked sooner. Performed at [facility], [address], [place], [place] [address]   Culture, body fluid-bottle     Status: None (Preliminary result)   Collection Time: 09/27/19  9:45 AM   Specimen: Ascitic  Result Value Ref Range Status   Specimen Description ASCITIC  Final   Special Requests BOTTLES DRAWN AEROBIC AND ANAEROBIC 10CC  Final   Culture   Final    NO GROWTH 3 DAYS Performed at [facility], [address] [place], [place] [address]    Report Status PENDING  Incomplete  Gram stain     Status: None   Collection Time: 09/27/19  9:52 AM   Specimen: Ascitic; Body Fluid  Result Value Ref Range Status   Specimen Description ASCITIC  Final   Special Requests NONE  Final   Gram Stain   Final    NO ORGANISMS SEEN WBC PRESENT, PREDOMINANTLY MONONUCLEAR CYTOSPIN SMEAR Performed at [facility], [address] [place], [place] [address]    Report Status 09/27/2019 FINAL  Final         Radiology Studies: No results found.      Scheduled Meds: .  Chlorhexidine Gluconate Cloth  6 each Topical Daily  . furosemide  60 mg Intravenous BID  . influenza vac split quadrivalent PF  0.5 mL Intramuscular Tomorrow-1000  . nystatin   Topical TID  . potassium chloride  20 mEq Oral BID  . spironolactone  100 mg Oral Daily   Continuous Infusions:   LOS: 3 days    Time spent: 30 minutes    Takoda Siedlecki Hoover Brunette, DO [facility] Pager [phone]  If 7PM-7AM, please contact night-coverage [email or web address] Password [email or web address] 09/30/2019, 10:28 AM

## 2019-09-30 NOTE — Progress Notes (Signed)
Subjective:  Patient feels much better.  She denies abdominal pain nausea vomiting melena or rectal bleeding.  She says she is passing a lot of urine.  She did have a bowel movement earlier today.  She wants to go home today.  Patient states she is unable to lie supine because she feels she is smothering because of her large breasts.  She states she sleeps prone which she has done for several years.   Objective: Blood pressure (!) 107/51, pulse 89, temperature (!) 97.5 F (36.4 C), temperature source Oral, resp. rate 20, height 5' 7"  (1.702 m), weight (!) 190.3 kg, SpO2 96 %. Patient is alert and in no acute distress. She is eating her lunch. She does not have asterixis. Cardiac exam with regular rhythm normal S1 and S2.  She has faint systolic murmur at aortic area. Auscultation lungs reveal vesicular breath sounds bilaterally. Abdomen is very large but soft and nontender. She has pitting and nonpitting edema involving both legs.  She has some blisters over her right leg and thigh covered with DuoDERM.    Urine output 375 0 mL last 24 hours.  Labs/studies Results:  CBC Latest Ref Rng & Units 09/29/2019 09/28/2019 09/27/2019  WBC 4.0 - 10.5 K/uL 3.2(L) 3.2(L) 3.6(L)  Hemoglobin 12.0 - 15.0 g/dL 11.9(L) 11.9(L) 13.0  Hematocrit 36.0 - 46.0 % 36.4 38.0 40.5  Platelets 150 - 400 K/uL 84(L) 92(L) 92(L)    CMP Latest Ref Rng & Units 09/30/2019 09/29/2019 09/28/2019  Glucose 70 - 99 mg/dL 83 83 89  BUN 6 - 20 mg/dL 15 15 13   Creatinine 0.44 - 1.00 mg/dL 0.76 0.75 0.82  Sodium 135 - 145 mmol/L 140 143 143  Potassium 3.5 - 5.1 mmol/L 3.5 3.6 3.5  Chloride 98 - 111 mmol/L 108 109 111  CO2 22 - 32 mmol/L 25 25 24   Calcium 8.9 - 10.3 mg/dL 7.8(L) 8.0(L) 8.1(L)  Total Protein 6.5 - 8.1 g/dL - 5.1(L) 5.3(L)  Total Bilirubin 0.3 - 1.2 mg/dL - 1.0 1.5(H)  Alkaline Phos 38 - 126 U/L - 50 56  AST 15 - 41 U/L - 37 37  ALT 0 - 44 U/L - 20 17    Hepatic Function Latest Ref Rng & Units  09/29/2019 09/28/2019 09/27/2019  Total Protein 6.5 - 8.1 g/dL 5.1(L) 5.3(L) 5.8(L)  Albumin 3.5 - 5.0 g/dL 1.9(L) 2.0(L) 2.2(L)  AST 15 - 41 U/L 37 37 40  ALT 0 - 44 U/L 20 17 21   Alk Phosphatase 38 - 126 U/L 50 56 62  Total Bilirubin 0.3 - 1.2 mg/dL 1.0 1.5(H) 1.3(H)     Assessment:  #1.  Fluid overload in the setting of advanced cirrhosis.  She has both ascites as well as anasarca.  She had 4 L removed the other day.  She is diuresing well.  She her weight is down by 6 kg in the last 24 hours which is probably not accurate.  #2.  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  She has thrombocytopenia.  Her albumin is very low.  She may benefit from albumin infusion.  I am afraid when she goes home she may not respond to oral diuretic.  #3.  Morbid obesity.  She already has been evaluated by dietitian and instructions provided.   Recommendations  Albumin 50 g IV today.  If she has no side effects she will be give another dose tomorrow. She can be transition to p.o. furosemide at the same dose at the time of discharge.

## 2019-10-01 LAB — COMPREHENSIVE METABOLIC PANEL
ALT: 24 U/L (ref 0–44)
AST: 43 U/L — ABNORMAL HIGH (ref 15–41)
Albumin: 2 g/dL — ABNORMAL LOW (ref 3.5–5.0)
Alkaline Phosphatase: 48 U/L (ref 38–126)
Anion gap: 7 (ref 5–15)
BUN: 14 mg/dL (ref 6–20)
CO2: 28 mmol/L (ref 22–32)
Calcium: 8.1 mg/dL — ABNORMAL LOW (ref 8.9–10.3)
Chloride: 108 mmol/L (ref 98–111)
Creatinine, Ser: 0.76 mg/dL (ref 0.44–1.00)
GFR calc Af Amer: >60 mL/min (ref >60)
GFR calc non Af Amer: >60 mL/min (ref >60)
Glucose, Bld: 83 mg/dL (ref 70–99)
Potassium: 3.6 mmol/L (ref 3.5–5.1)
Sodium: 143 mmol/L (ref 135–145)
Total Bilirubin: 0.9 mg/dL (ref 0.3–1.2)
Total Protein: 5.2 g/dL — ABNORMAL LOW (ref 6.5–8.1)

## 2019-10-01 LAB — MAGNESIUM: Magnesium: 2 mg/dL (ref 1.7–2.4)

## 2019-10-01 MED ORDER — ALBUMIN HUMAN 25 % IV SOLN
50.0000 g | Freq: Once | INTRAVENOUS | Status: AC
  Administered 2019-10-01: 50 g via INTRAVENOUS
  Filled 2019-10-01: qty 200

## 2019-10-01 NOTE — Telephone Encounter (Signed)
Requested records from PCP and [facility]

## 2019-10-01 NOTE — Plan of Care (Signed)

## 2019-10-01 NOTE — Progress Notes (Signed)
    Subjective: Feels lower extremity edema is improving. No overt GI bleeding. No confusion or mental status changes. No abdominal pain. Eager to go home. Tearful when discussing Halloween activities on Friday with her granddaughter (61 years old), as traditionally they have spent this together. Worried she won't be home in time.   Objective: Vital signs in last 24 hours: Temp:  [98.1 F (36.7 C)-98.2 F (36.8 C)] 98.1 F (36.7 C) (10/26 0935) Pulse Rate:  [82-97] 97 (10/26 0935) Resp:  [20] 20 (10/25 2050) BP: (96-120)/(68-83) 120/83 (10/26 0935) SpO2:  [94 %-99 %] 98 % (10/26 0935) Weight:  [242 kg] 186 kg (10/26 0530) Last BM Date: 09/29/19 General:   Alert and oriented, pleasant, sallow-appearing Abdomen:  Obese abdomen with large pannus, no obvious tense ascites, no TTP, rebound or guarding. +BS Extremities:  With edema bilateral lower extremities 2+, wound dressing to RLE, marked pedal edema Neurologic:  Alert and  oriented x4   Intake/Output from previous day: 10/25 0701 - 10/26 0700 In: 240 [P.O.:240] Out: 5050 [Urine:5050] Intake/Output this shift: Total I/O In: -  Out: 275 [Urine:275]  Lab Results: Recent Labs    09/29/19 0626  WBC 3.2*  HGB 11.9*  HCT 36.4  PLT 84*   BMET Recent Labs    09/29/19 0626 09/30/19 0617 10/01/19 0629  NA 143 140 143  K 3.6 3.5 3.6  CL 109 108 108  CO2 25 25 28   GLUCOSE 83 83 83  BUN 15 15 14   CREATININE 0.75 0.76 0.76  CALCIUM 8.0* 7.8* 8.1*   LFT Recent Labs    09/29/19 0626 10/01/19 0629  PROT 5.1* 5.2*  ALBUMIN 1.9* 2.0*  AST 37 43*  ALT 20 24  ALKPHOS 50 48  BILITOT 1.0 0.9   Lab Results  Component Value Date   INR 1.1 09/27/2019    Assessment: 63 year old female with decompensated NASH cirrhosis, anasarca in setting of hypoalbuminemia, ascites s/p para on 10/22 with 4 liters removed and negative for SBP, slowly diuresing this admission. Weights and I/Os reviewed and down 9 lbs from yesterday, query  accuracy, but over 20 lbs since admission.   Hypoalbuminemia: no significant change. IV albumin given yesterday. Will repeat today and continue with diuresing efforts.    Plan: Albumin IV X 1 today, reassess tomorrow Continue diuresis Transition to oral lasix at discharge 2 gram sodium diet Cirrhosis care as outpatient Records requested from [facility] regarding prior EGD by Dr. Saul Fordyce in the morning.   Annitta Needs, PhD, ANP-BC [facility]    LOS: 4 days    10/01/2019, 10:06 AM

## 2019-10-01 NOTE — Progress Notes (Signed)
PROGRESS NOTE    Alexis Blankenship  GLO:[id number] DOB: 07-Mar-1956 DOA: 09/26/2019 PCP: [facility]   Brief Narrative:  Per HPI: Alexis Blankenship a 63 y.o.femalewith medical history significant ofosteoarthritis of the knees, COPD, morbid obesity, liver cirrhosis due to Elita Boone who is coming to the emergency department due to progressively worse edema for the past month or so. She states she has not taken her medications in several months due to lack of health care insurance. She complains of dyspnea on exertion and orthopnea. She denies fever, chills, sore throat, rhinorrhea, chest pain, palpitations, diaphoresis, nausea, emesis, diarrhea, melena or hematochezia. She gets constipation occasionally. No dysuria, frequency or hematuria. Denies polyuria, polydipsia, polyphagia or blurred vision.  10/22: Patient was admitted with anasarca secondary to poor outpatient follow-up and lack of medications over the last several months in the setting of history ofNASHliver cirrhosis.She has been started on IV Lasix 60 mg twice daily. She has undergone paracentesis with 4 L of fluid removed. GI consultation ordered and pending.  10/23:Patient appears to be clinically doing better after paracentesis yesterday. She continues to diurese quite well, however urine outputs are inaccurately recorded on account of the fact that she has had a lot of urination on her bed. She has been started on spironolactone by GI yesterday.  10/24:Patient overall doing better and appears to be diuresing, but accurate I's and O's cannot be documented on account of the fact that she tends to urinate in the bed and not make it to the commode. We will try Foley catheter for more accurate measurement today. Dietitian consultation pending. Appreciate ongoing GI evaluation.  10/25: Patient continues to diurese quite well as evidenced on urine output.  Negative fluid balance of 2.8 L noted.  She  appears to still be diuresing quite well and will plan to remove Foley catheter today and follow labs to continue diuresis to renal tolerance.  She may require some IV albumin infusion, however her blood pressures are holding steady and she is diuresing well and will avoid albumin for now.  Appreciate GI evaluation.  No signs of SBP on paracentesis.  Patient is eager to go home.  10/26: Patient continues to diurese very well after albumin infusion yesterday with almost 4.8 L net negative fluid balance in the last 24 hours.  Her creatinine remained stable, but her albumin is still low at 2.  Appreciate GI evaluation with recommendations for repeat IV albumin infusion today.  Patient is eager to go home for Halloween.  Assessment & Plan:   Principal Problem:   Anasarca Active Problems:   Liver cirrhosis secondary to NASH (HCC)   Morbid obesity (HCC)   COPD (chronic obstructive pulmonary disease) (HCC)   Hypokalemia   Ascites   Anasarca secondary to Elita Boone liver cirrhosis -Appreciate GI consultation, with poor diet noted at home -Dietitian consultation appreciated -Patient is status post paracentesis of 4 L of fluid -Continue on fluid and sodium restriction -Continue Lasix 60 mg IV twice daily with good diuresis noted. -Monitor daily weights and strict I's and O's, plan to DC Foley catheter today -Patient has had poor follow-up and lack of medications -Albumin  to, recheck in a.m. and appreciate GI for albumin infusions  Thrombocytopenia-stable -Secondary to cirrhosis -Monitor CBC -Avoid heparin agents  Morbid obesity -Lifestyle interventions  COPD with tobacco abuse -Counseled on smoking cessation  Hypokalemia-resolved -Currently supplemented -We will continue supplementation with aggressive IV Lasix -Check magnesium in a.m. -Monitor a.m. labs  DVT prophylaxis:SCDs Code  Status:Full Family Communication:None at bedside, patient does have a son Disposition  Plan:Continue IV diuresis and albumin as needed. AppreciateongoingGI evaluation.  Plan to discontinue Foley catheter today, but continue monitoring of I's and O's.    Consultants:  GI  Procedures:  Paracentesis of 4 L of fluid on 10/22  Antimicrobials:   None  Subjective: Patient seen and evaluated today with no new acute complaints or concerns. No acute concerns or events noted overnight.  Objective: Vitals:   09/30/19 2050 09/30/19 2051 10/01/19 0530 10/01/19 0935  BP: 96/78 106/68 110/74 120/83  Pulse: 82 82 89 97  Resp: 20     Temp: 98.1 F (36.7 C)  98.2 F (36.8 C) 98.1 F (36.7 C)  TempSrc: Oral  Oral Oral  SpO2: 99%  94% 98%  Weight:   (!) 186 kg   Height:        Intake/Output Summary (Last 24 hours) at 10/01/2019 1223 Last data filed at 10/01/2019 1130 Gross per 24 hour  Intake 240 ml  Output 3725 ml  Net -3485 ml   Filed Weights   09/29/19 0534 09/30/19 0500 10/01/19 0530  Weight: (!) 196.7 kg (!) 190.3 kg (!) 186 kg    Examination:  General exam: Appears calm and comfortable, volume overloaded and obese Respiratory system: Clear to auscultation. Respiratory effort normal. Cardiovascular system: S1 & S2 heard, RRR. No JVD, murmurs, rubs, gallops or clicks. No pedal edema. Gastrointestinal system: Abdomen is nondistended, soft and nontender. No organomegaly or masses felt. Normal bowel sounds heard. Central nervous system: Alert and oriented. No focal neurological deficits. Extremities: Symmetric 5 x 5 power. Skin: No rashes, lesions or ulcers Psychiatry: Anxious about going home    Data Reviewed: I have personally reviewed following labs and imaging studies  CBC: Recent Labs  Lab 09/26/19 2115 09/27/19 0401 09/28/19 0507 09/29/19 0626  WBC 3.3* 3.6* 3.2* 3.2*  NEUTROABS 1.9  --   --   --   HGB 14.2 13.0 11.9* 11.9*  HCT 45.2 40.5 38.0 36.4  MCV 104.6* 103.6* 103.8* 102.5*  PLT 98* 92* 92* 84*   Basic Metabolic Panel:  Recent Labs  Lab 09/26/19 2115 09/27/19 0401 09/28/19 0507 09/29/19 0626 09/30/19 0617 10/01/19 0629  NA 141 144 143 143 140 143  K 3.3* 3.7 3.5 3.6 3.5 3.6  CL 110 112* 111 109 108 108  CO2 24 23 24 25 25 28   GLUCOSE 94 90 89 83 83 83  BUN 13 12 13 15 15 14   CREATININE 0.75 0.71 0.82 0.75 0.76 0.76  CALCIUM 8.4* 8.4* 8.1* 8.0* 7.8* 8.1*  MG 2.1  --  1.9 1.8 1.9 2.0  PHOS 3.3  --   --   --   --   --    GFR: Estimated Creatinine Clearance: 131.5 mL/min (by C-G formula based on SCr of 0.76 mg/dL). Liver Function Tests: Recent Labs  Lab 09/26/19 2115 09/27/19 0401 09/28/19 0507 09/29/19 0626 10/01/19 0629  AST 42* 40 37 37 43*  ALT 23 21 17 20 24   ALKPHOS 74 62 56 50 48  BILITOT 1.1 1.3* 1.5* 1.0 0.9  PROT 6.7 5.8* 5.3* 5.1* 5.2*  ALBUMIN 2.4* 2.2* 2.0* 1.9* 2.0*   Recent Labs  Lab 09/26/19 2115  LIPASE 24   No results for input(s): AMMONIA in the last 168 hours. Coagulation Profile: Recent Labs  Lab 09/27/19 0401  INR 1.1   Cardiac Enzymes: No results for input(s): CKTOTAL, CKMB, CKMBINDEX, TROPONINI in the last 168  hours. BNP (last 3 results) No results for input(s): PROBNP in the last 8760 hours. HbA1C: No results for input(s): HGBA1C in the last 72 hours. CBG: No results for input(s): GLUCAP in the last 168 hours. Lipid Profile: No results for input(s): CHOL, HDL, LDLCALC, TRIG, CHOLHDL, LDLDIRECT in the last 72 hours. Thyroid Function Tests: No results for input(s): TSH, T4TOTAL, FREET4, T3FREE, THYROIDAB in the last 72 hours. Anemia Panel: No results for input(s): VITAMINB12, FOLATE, FERRITIN, TIBC, IRON, RETICCTPCT in the last 72 hours. Sepsis Labs: No results for input(s): PROCALCITON, LATICACIDVEN in the last 168 hours.  Recent Results (from the past 240 hour(s))  SARS CORONAVIRUS 2 (TAT 6-24 HRS) Nasopharyngeal Nasopharyngeal Swab     Status: None   Collection Time: 09/27/19  1:16 AM   Specimen: Nasopharyngeal Swab  Result Value Ref Range  Status   SARS Coronavirus 2 NEGATIVE NEGATIVE Final    Comment: (NOTE) SARS-CoV-2 target nucleic acids are NOT DETECTED. The SARS-CoV-2 RNA is generally detectable in upper and lower respiratory specimens during the acute phase of infection. Negative results do not preclude SARS-CoV-2 infection, do not rule out co-infections with other pathogens, and should not be used as the sole basis for treatment or other patient management decisions. Negative results must be combined with clinical observations, patient history, and epidemiological information. The expected result is Negative. Fact Sheet for Patients: SugarRoll.be Fact Sheet for Healthcare Providers: https://www.woods-mathews.com/ This test is not yet approved or cleared by the [place] FDA and  has been authorized for detection and/or diagnosis of SARS-CoV-2 by FDA under an Emergency Use Authorization (EUA). This EUA will remain  in effect (meaning this test can be used) for the duration of the COVID-19 declaration under Section 56 4(b)(1) of the Act, 21 U.S.C. section 360bbb-3(b)(1), unless the authorization is terminated or revoked sooner. Performed at [facility], [place] [address], [place], [place] [address]   Culture, body fluid-bottle     Status: None (Preliminary result)   Collection Time: 09/27/19  9:45 AM   Specimen: Ascitic  Result Value Ref Range Status   Specimen Description ASCITIC  Final   Special Requests BOTTLES DRAWN AEROBIC AND ANAEROBIC 10CC  Final   Culture   Final    NO GROWTH 4 DAYS Performed at [facility], [address] [place], [place] [address]    Report Status PENDING  Incomplete  Gram stain     Status: None   Collection Time: 09/27/19  9:52 AM   Specimen: Ascitic; Body Fluid  Result Value Ref Range Status   Specimen Description ASCITIC  Final   Special Requests NONE  Final   Gram Stain   Final    NO ORGANISMS SEEN WBC PRESENT,  PREDOMINANTLY MONONUCLEAR CYTOSPIN SMEAR Performed at [facility], [address] [place], [place] [address]    Report Status 09/27/2019 FINAL  Final         Radiology Studies: No results found.      Scheduled Meds: . Chlorhexidine Gluconate Cloth  6 each Topical Daily  . furosemide  60 mg Intravenous BID  . influenza vac split quadrivalent PF  0.5 mL Intramuscular Tomorrow-1000  . nystatin   Topical TID  . potassium chloride  20 mEq Oral BID  . spironolactone  100 mg Oral Daily   Continuous Infusions: . albumin human       LOS: 4 days    Time spent: 30 minutes    Sherrol Vicars Darleen Crocker, DO [facility] Pager [phone]  If 7PM-7AM, please contact night-coverage [email or web address] Password [facility] 10/01/2019, 12:23 PM

## 2019-10-02 DIAGNOSIS — R601 Generalized edema: Secondary | ICD-10-CM | POA: Diagnosis not present

## 2019-10-02 LAB — COMPREHENSIVE METABOLIC PANEL
ALT: 25 U/L (ref 0–44)
AST: 48 U/L — ABNORMAL HIGH (ref 15–41)
Albumin: 2.6 g/dL — ABNORMAL LOW (ref 3.5–5.0)
Alkaline Phosphatase: 52 U/L (ref 38–126)
Anion gap: 8 (ref 5–15)
BUN: 15 mg/dL (ref 6–20)
CO2: 26 mmol/L (ref 22–32)
Calcium: 8.4 mg/dL — ABNORMAL LOW (ref 8.9–10.3)
Chloride: 107 mmol/L (ref 98–111)
Creatinine, Ser: 0.79 mg/dL (ref 0.44–1.00)
GFR calc Af Amer: >60 mL/min (ref >60)
GFR calc non Af Amer: >60 mL/min (ref >60)
Glucose, Bld: 79 mg/dL (ref 70–99)
Potassium: 3.6 mmol/L (ref 3.5–5.1)
Sodium: 141 mmol/L (ref 135–145)
Total Bilirubin: 1.1 mg/dL (ref 0.3–1.2)
Total Protein: 5.7 g/dL — ABNORMAL LOW (ref 6.5–8.1)

## 2019-10-02 LAB — CULTURE, BODY FLUID W GRAM STAIN -BOTTLE: Culture: NO GROWTH

## 2019-10-02 LAB — MAGNESIUM: Magnesium: 2 mg/dL (ref 1.7–2.4)

## 2019-10-02 MED ORDER — TRAMADOL HCL 50 MG PO TABS
50.0000 mg | ORAL_TABLET | Freq: Three times a day (TID) | ORAL | Status: DC | PRN
  Administered 2019-10-02 (×2): 50 mg via ORAL
  Filled 2019-10-02 (×2): qty 1

## 2019-10-02 NOTE — Plan of Care (Signed)

## 2019-10-02 NOTE — Progress Notes (Signed)
    Subjective: Reports feeling much improved. Abdomen is much softer. Swelling improving in legs. No overt GI bleeding. Bowels are moving daily. No nausea, vomiting, abdominal pain, confusion, or yellowing of skin or eyes. Asking if she will be discharged by Friday.   Objective: Vital signs in last 24 hours: Temp:  [98.1 F (36.7 C)-98.2 F (36.8 C)] 98.2 F (36.8 C) (10/26 2057) Pulse Rate:  [87-93] 93 (10/27 0609) Resp:  [18-22] 18 (10/27 0609) BP: (97-120)/(61-72) 97/62 (10/27 0609) SpO2:  [95 %-98 %] 95 % (10/27 0609) Weight:  [182 kg] 182 kg (10/27 0500) Last BM Date: 10/01/19 General:   Alert and oriented, pleasant Head:  Normocephalic and atraumatic. Eyes:  No icterus, sclera clear. Conjuctiva pink.  Abdomen: Obese abdomen with large pannus. Soft, nontender. Some pitting edema of the lower abdomen present. Patient states this is improved. Bowel sounds present. No rebound or guarding. Extremities:  With bilateral 2+ pitting edema up to her knees. Would dressing in place on right lower extremity. Patient reports edema was in her thighs.  Neurologic:  Alert and  oriented x4;  grossly normal neurologically. Psych: Normal mood and affect.  Intake/Output from previous day: 10/26 0701 - 10/27 0700 In: -  Out: 4150 [Urine:4150]  Lab Results: No results for input(s): WBC, HGB, HCT, PLT in the last 72 hours. BMET Recent Labs    09/30/19 0617 10/01/19 0629 10/02/19 0620  NA 140 143 141  K 3.5 3.6 3.6  CL 108 108 107  CO2 25 28 26   GLUCOSE 83 83 79  BUN 15 14 15   CREATININE 0.76 0.76 0.79  CALCIUM 7.8* 8.1* 8.4*   LFT Recent Labs    10/01/19 0629 10/02/19 0620  PROT 5.2* 5.7*  ALBUMIN 2.0* 2.6*  AST 43* 48*  ALT 24 25  ALKPHOS 48 46  BILITOT 0.9 1.1   Assessment: 63 year old female with history of NASH cirrhosis (previously evaluated by Dr. Britta Mccreedy) who presented to the ED on 10/21 with decompensation, anasarca in the setting of hypoalbuminemia, ascites s/p para  on 10/22 with 4 L removed and negative for SBP, slowly diuresing this admission with IV Lasix 60 mg and p.o. spironolactone 100 mg.  She has received two 50 g boluses of albumin on 10/25 and 10/26.  Albumin improved to 2.6 today. Weight down about 9 pounds since yesterday.  Appears to be total of 32 pound weight loss since 10/22.  Kidney function remains within normal limits. MELD 8 on admission. Will continue diuresing. She will need ongoing outpatient management of her cirrhosis.   Plan: Continue IV Lasix and spironolactone p.o. Continue to follow I/Os closely Follow <2g sodium diet.  Transition to oral lasix at discharge.  Cirrhosis care as outpatient with [facility] has requested records from PCP and [facility].    LOS: 5 days    10/02/2019, 12:14 PM   Aliene Altes, [facility]

## 2019-10-02 NOTE — Progress Notes (Signed)
PROGRESS NOTE    Alexis FarrierDawn Zogg  WUJ:[id number] DOB: 10/04/1956 DOA: 09/26/2019 [facility]   Brief Narrative:  Per HPI: Alexis PentonDawn Oneillis a 63 y.o.femalewith medical history significant ofosteoarthritis of the knees, COPD, morbid obesity, liver cirrhosis due to Alexis Blankenship who is coming to the emergency department due to progressively worse edema for the past month or so. She states she has not taken her medications in several months due to lack of health care insurance. She complains of dyspnea on exertion and orthopnea. She denies fever, chills, sore throat, rhinorrhea, chest pain, palpitations, diaphoresis, nausea, emesis, diarrhea, melena or hematochezia. She gets constipation occasionally. No dysuria, frequency or hematuria. Denies polyuria, polydipsia, polyphagia or blurred vision.  10/22: Patient was admitted with anasarca secondary to poor outpatient follow-up and lack of medications over the last several months in the setting of history ofNASHliver cirrhosis.She has been started on IV Lasix 60 mg twice daily. She has undergone paracentesis with 4 L of fluid removed. GI consultation ordered and pending.  10/23:Patient appears to be clinically doing better after paracentesis yesterday. She continues to diurese quite well, however urine outputs are inaccurately recorded on account of the fact that she has had a lot of urination on her bed. She has been started on spironolactone by GI yesterday.  10/24:Patient overall doing better and appears to be diuresing, but accurate I's and O's cannot be documented on account of the fact that she tends to urinate in the bed and not make it to the commode. We will try Foley catheter for more accurate measurement today. Dietitian consultation pending. Appreciate ongoing GI evaluation.  10/25:Patient continues to diurese quite well as evidenced on urine output. Negative fluid balance of 2.8 L noted. She  appears to still be diuresing quite well and will plan to remove Foley catheter today and follow labs to continue diuresis to renal tolerance. She may require some IV albumin infusion, however her blood pressures are holding steady and she is diuresing well and will avoid albumin for now. Appreciate GI evaluation. No signs of SBP on paracentesis. Patient is eager to go home.  10/26: Patient continues to diurese very well after albumin infusion yesterday with almost 4.8 L net negative fluid balance in the last 24 hours.  Her creatinine remained stable, but her albumin is still low at 2.  Appreciate GI evaluation with recommendations for repeat IV albumin infusion today.  Patient is eager to go home for Halloween.  10/27: Patient overall continues to diurese quite well with over 4 L of net negative fluid balance in the last 24 hours.  She is down a little over 30 pounds since 10/21.  Her renal function continues to tolerate her diuresis and patient is doing better each day.  Assessment & Plan:   Principal Problem:   Anasarca Active Problems:   Liver cirrhosis secondary to NASH (HCC)   Morbid obesity (HCC)   COPD (chronic obstructive pulmonary disease) (HCC)   Hypokalemia   Ascites   [facility]ly improving -She is currently down 14 L of fluid -Appreciate GI consultation, with poor diet noted at home -Dietitian consultation appreciated -Patient is status post paracentesis of 4 L of fluid -Continue on fluid and sodium restriction -Continue Lasix 60 mg IV twice dailywith good diuresis noted. -Monitor daily weights and strict I's and O's,plan to DC Foley catheter today -Patient has had poor follow-up and lack of medications -Albumin to, recheck in a.m. and appreciate GI for  albumin infusions  Thrombocytopenia-stable -Secondary to cirrhosis -Monitor CBC -Avoid heparin agents  Morbid obesity -Lifestyle interventions  COPD with tobacco abuse  -Counseled on smoking cessation  Hypokalemia-resolved -Currently supplemented -We will continue supplementation with aggressive IV Lasix -Check magnesium in a.m. -Monitor a.m. labs  DVT prophylaxis:SCDs Code Status:Full Family Communication:None at bedside, patient does have a son Disposition Plan:Continue IV diuresis and albumin as needed. AppreciateongoingGI evaluation.    Consultants:  GI  Procedures:  Paracentesis of 4 L of fluid on 10/22  Antimicrobials:   None  Subjective: Patient seen and evaluated today with no new acute complaints or concerns. No acute concerns or events noted overnight.  She is overall diuresing well.  Objective: Vitals:   10/01/19 1448 10/01/19 2057 10/02/19 0500 10/02/19 0609  BP: 120/61 111/72  97/62  Pulse: 87 93  93  Resp: 18 (!) 22  18  Temp: 98.1 F (36.7 C) 98.2 F (36.8 C)    TempSrc: Oral Oral    SpO2: 98% 96%  95%  Weight:   (!) 182 kg   Height:        Intake/Output Summary (Last 24 hours) at 10/02/2019 1258 Last data filed at 10/02/2019 0300 Gross per 24 hour  Intake -  Output 2975 ml  Net -2975 ml   Filed Weights   09/30/19 0500 10/01/19 0530 10/02/19 0500  Weight: (!) 190.3 kg (!) 186 kg (!) 182 kg    Examination:  General exam: Appears calm and comfortable, obese and volume overloaded Respiratory system: Clear to auscultation. Respiratory effort normal. Cardiovascular system: S1 & S2 heard, RRR. No JVD, murmurs, rubs, gallops or clicks. No pedal edema. Gastrointestinal system: Abdomen is nondistended, soft and nontender. No organomegaly or masses felt. Normal bowel sounds heard. Central nervous system: Alert and oriented. No focal neurological deficits. Extremities: Symmetric 5 x 5 power. Skin: No rashes, lesions or ulcers Psychiatry: Judgement and insight appear normal. Mood & affect appropriate.     Data Reviewed: I have personally reviewed following labs and imaging studies  CBC:  Recent Labs  Lab 09/26/19 2115 09/27/19 0401 09/28/19 0507 09/29/19 0626  WBC 3.3* 3.6* 3.2* 3.2*  NEUTROABS 1.9  --   --   --   HGB 14.2 13.0 11.9* 11.9*  HCT 45.2 40.5 38.0 36.4  MCV 104.6* 103.6* 103.8* 102.5*  PLT 98* 92* 92* 84*   Basic Metabolic Panel: Recent Labs  Lab 09/26/19 2115  09/28/19 0507 09/29/19 0626 09/30/19 0617 10/01/19 0629 10/02/19 0620  NA 141   < > 143 143 140 143 141  K 3.3*   < > 3.5 3.6 3.5 3.6 3.6  CL 110   < > 111 109 108 108 107  CO2 24   < > GLUCOSE 94   < > 89 83 83 83 79  BUN 13   < > CREATININE 0.75   < > 0.82 0.75 0.76 0.76 0.79  CALCIUM 8.4*   < > 8.1* 8.0* 7.8* 8.1* 8.4*  MG 2.1  --  1.9 1.8 1.9 2.0 2.0  PHOS 3.3  --   --   --   --   --   --    < > = values in this interval not displayed.   GFR: Estimated Creatinine Clearance: 129.6 mL/min (by C-G formula based on SCr of 0.79 mg/dL). Liver Function Tests: Recent Labs  Lab 09/27/19 0401 09/28/19 0507 09/29/19 4782 10/01/19 9562 10/02/19 1308  AST 40 37 37 43* 48*  ALT 21 17 20 24 25   ALKPHOS 62 56 50 48 52  BILITOT 1.3* 1.5* 1.0 0.9 1.1  PROT 5.8* 5.3* 5.1* 5.2* 5.7*  ALBUMIN 2.2* 2.0* 1.9* 2.0* 2.6*   Recent Labs  Lab 09/26/19 2115  LIPASE 24   No results for input(s): AMMONIA in the last 168 hours. Coagulation Profile: Recent Labs  Lab 09/27/19 0401  INR 1.1   Cardiac Enzymes: No results for input(s): CKTOTAL, CKMB, CKMBINDEX, TROPONINI in the last 168 hours. BNP (last 3 results) No results for input(s): PROBNP in the last 8760 hours. HbA1C: No results for input(s): HGBA1C in the last 72 hours. CBG: No results for input(s): GLUCAP in the last 168 hours. Lipid Profile: No results for input(s): CHOL, HDL, LDLCALC, TRIG, CHOLHDL, LDLDIRECT in the last 72 hours. Thyroid Function Tests: No results for input(s): TSH, T4TOTAL, FREET4, T3FREE, THYROIDAB in the last 72 hours. Anemia Panel: No results for input(s): VITAMINB12,  FOLATE, FERRITIN, TIBC, IRON, RETICCTPCT in the last 72 hours. Sepsis Labs: No results for input(s): PROCALCITON, LATICACIDVEN in the last 168 hours.  Recent Results (from the past 240 hour(s))  SARS CORONAVIRUS 2 (TAT 6-24 HRS) Nasopharyngeal Nasopharyngeal Swab     Status: None   Collection Time: 09/27/19  1:16 AM   Specimen: Nasopharyngeal Swab  Result Value Ref Range Status   SARS Coronavirus 2 NEGATIVE NEGATIVE Final    Comment: (NOTE) SARS-CoV-2 target nucleic acids are NOT DETECTED. The SARS-CoV-2 RNA is generally detectable in upper and lower respiratory specimens during the acute phase of infection. Negative results do not preclude SARS-CoV-2 infection, do not rule out co-infections with other pathogens, and should not be used as the sole basis for treatment or other patient management decisions. Negative results must be combined with clinical observations, patient history, and epidemiological information. The expected result is Negative. Fact Sheet for Patients: SugarRoll.be Fact Sheet for Healthcare Providers: https://www.woods-mathews.com/ This test is not yet approved or cleared by the [place] FDA and  has been authorized for detection and/or diagnosis of SARS-CoV-2 by FDA under an Emergency Use Authorization (EUA). This EUA will remain  in effect (meaning this test can be used) for the duration of the COVID-19 declaration under Section 56 4(b)(1) of the Act, 21 U.S.C. section 360bbb-3(b)(1), unless the authorization is terminated or revoked sooner. Performed at [facility], [place] [address], [place], [place] [address]   Culture, body fluid-bottle     Status: None   Collection Time: 09/27/19  9:45 AM   Specimen: Ascitic  Result Value Ref Range Status   Specimen Description ASCITIC  Final   Special Requests BOTTLES DRAWN AEROBIC AND ANAEROBIC 10CC  Final   Culture   Final    NO GROWTH 5 DAYS Performed at [facility], [address] [place], [place] [address]    Report Status 10/02/2019 FINAL  Final  Gram stain     Status: None   Collection Time: 09/27/19  9:52 AM   Specimen: Ascitic; Body Fluid  Result Value Ref Range Status   Specimen Description ASCITIC  Final   Special Requests NONE  Final   Gram Stain   Final    NO ORGANISMS SEEN WBC PRESENT, PREDOMINANTLY MONONUCLEAR CYTOSPIN SMEAR Performed at [facility], [address] [place],  [address]    Report Status 09/27/2019 FINAL  Final         Radiology Studies: No results found.  Scheduled Meds: . Chlorhexidine Gluconate Cloth  6 each Topical Daily  . furosemide  60 mg Intravenous BID  . influenza vac split quadrivalent PF  0.5 mL Intramuscular Tomorrow-1000  . nystatin   Topical TID  . potassium chloride  20 mEq Oral BID  . spironolactone  100 mg Oral Daily   Continuous Infusions:   LOS: 5 days    Time spent: 30 minutes    Ziyonna Christner Hoover Brunette, DO [facility] Pager [phone]  If 7PM-7AM, please contact night-coverage [email or web address] Password [email or web address] 10/02/2019, 12:58 PM

## 2019-10-03 ENCOUNTER — Encounter: Payer: Self-pay | Admitting: Nurse Practitioner

## 2019-10-03 ENCOUNTER — Telehealth: Payer: Self-pay | Admitting: Nurse Practitioner

## 2019-10-03 DIAGNOSIS — R188 Other ascites: Secondary | ICD-10-CM

## 2019-10-03 DIAGNOSIS — E876 Hypokalemia: Secondary | ICD-10-CM

## 2019-10-03 DIAGNOSIS — J449 Chronic obstructive pulmonary disease, unspecified: Secondary | ICD-10-CM

## 2019-10-03 LAB — BASIC METABOLIC PANEL
Anion gap: 9 (ref 5–15)
BUN: 16 mg/dL (ref 6–20)
CO2: 27 mmol/L (ref 22–32)
Calcium: 8.3 mg/dL — ABNORMAL LOW (ref 8.9–10.3)
Chloride: 105 mmol/L (ref 98–111)
Creatinine, Ser: 0.81 mg/dL (ref 0.44–1.00)
GFR calc Af Amer: >60 mL/min (ref >60)
GFR calc non Af Amer: >60 mL/min (ref >60)
Glucose, Bld: 84 mg/dL (ref 70–99)
Potassium: 3.5 mmol/L (ref 3.5–5.1)
Sodium: 141 mmol/L (ref 135–145)

## 2019-10-03 LAB — CBC
HCT: 36.6 % (ref 36.0–46.0)
Hemoglobin: 11.6 g/dL — ABNORMAL LOW (ref 12.0–15.0)
MCH: 33 pg (ref 26.0–34.0)
MCHC: 31.7 g/dL (ref 30.0–36.0)
MCV: 104.3 fL — ABNORMAL HIGH (ref 80.0–100.0)
Platelets: 91 10*3/uL — ABNORMAL LOW (ref 150–400)
RBC: 3.51 MIL/uL — ABNORMAL LOW (ref 3.87–5.11)
RDW: 14.9 % (ref 11.5–15.5)
WBC: 2.8 10*3/uL — ABNORMAL LOW (ref 4.0–10.5)
nRBC: 0 % (ref 0.0–0.2)

## 2019-10-03 MED ORDER — FUROSEMIDE 40 MG PO TABS: 40.0000 mg | ORAL_TABLET | Freq: Every day | ORAL | 2 refills | Status: DC

## 2019-10-03 MED ORDER — SPIRONOLACTONE 100 MG PO TABS: 100.0000 mg | ORAL_TABLET | Freq: Every day | ORAL | 2 refills | Status: DC

## 2019-10-03 MED ORDER — PANTOPRAZOLE SODIUM 20 MG PO TBEC: 20.0000 mg | DELAYED_RELEASE_TABLET | Freq: Every day | ORAL | 2 refills | Status: DC

## 2019-10-03 NOTE — Progress Notes (Signed)
Subjective: Feeling good. States she can see a significant improvement in her swelling. Abdomen much softer, mostly residual edema in her ankles/feet. Denies abdominal pain, N/V. Having regular bowel movements, no hematochezia or melena.  Objective: Vital signs in last 24 hours: Temp:  [98 F (36.7 C)-98.4 F (36.9 C)] 98 F (36.7 C) (10/28 0741) Pulse Rate:  [83-90] 88 (10/28 0741) Resp:  [16-20] 20 (10/28 0741) BP: (100-124)/(56-86) 118/56 (10/28 0741) SpO2:  [95 %-96 %] 96 % (10/28 0741) Weight:  [181.5 kg] 181.5 kg (10/28 0500) Last BM Date: 10/02/19 General:   Alert and oriented, pleasant Head:  Normocephalic and atraumatic. Eyes:  No icterus, sclera clear. Conjuctiva pink.  Heart:  S1, S2 present, no murmurs noted.  Lungs: Clear to auscultation bilaterally, without wheezing, rales, or rhonchi.  Abdomen:  Bowel sounds present, obese but soft, non-tender, non-distended. No HSM or hernias noted. No rebound or guarding. No masses appreciated  Msk:  Symmetrical without gross deformities. Pulses:  Normal bilateral DP pulses noted. Extremities:  Without clubbing. Mild non-pitting to 1: pitting up to mid-calves. Neurologic:  Alert and  oriented x4;  grossly normal neurologically. Psych:  Alert and cooperative. Normal mood and affect.  Intake/Output from previous day: 10/27 0701 - 10/28 0700 In: 120 [P.O.:120] Out: 5100 [Urine:5100] Intake/Output this shift: Total I/O In: -  Out: 1050 [Urine:1050]  Lab Results: Recent Labs    10/03/19 0637  WBC 2.8*  HGB 11.6*  HCT 36.6  PLT 91*   BMET Recent Labs    10/01/19 0629 10/02/19 0620 10/03/19 0637  NA 143 141 141  K 3.6 3.6 3.5  CL 108 107 105  CO2 28 26 27   GLUCOSE 83 79 84  BUN 14 15 16   CREATININE 0.76 0.79 0.81  CALCIUM 8.1* 8.4* 8.3*   LFT Recent Labs    10/01/19 0629 10/02/19 0620  PROT 5.2* 5.7*  ALBUMIN 2.0* 2.6*  AST 43* 48*  ALT 24 25  ALKPHOS 48 52  BILITOT 0.9 1.1   PT/INR No results  for input(s): LABPROT, INR in the last 72 hours. Hepatitis Panel No results for input(s): HEPBSAG, HCVAB, HEPAIGM, HEPBIGM in the last 72 hours.   Studies/Results: No results found.  Assessment: 63 year old female with history of NASH cirrhosis (previously evaluated by Dr. Britta Mccreedy) who presented to the ED on 10/21 with decompensation, anasarca in the setting of hypoalbuminemia, ascites s/p para on 10/22 with 4 L removed and negative for SBP, slowly diuresing this admission with IV Lasix 60 mg and p.o. spironolactone 100 mg.  She has received two 50 g boluses of albumin on 10/25 and 10/26.  Albumin improved to 2.6 today. Weight down about 1.1 pounds since yesterday (yesterday was down 9 lb from previous day).  Appears to be total of 33.4 pound weight loss since 10/22.  Kidney function remains within normal limits BUN/Cr 16/0.81 today). MELD 8 on admission. Will continue diuresing. She will need ongoing outpatient management of her cirrhosis.  Today she remains on IV Lasix 60 mg twice daily and Spironalactone 100mg  daily. Diuresis seems to be slowing. She has seen inpatient dietician regarding low sodium diet (on a budget) and was provided education and support. overall her edema is significantly improved.  Plan: 1. Can likely transition Lasix to po 2. Anticiapted discharge in the next 24-48 hours from GI standpoint 3. Will need outpatient follow-up on cirrhosis 4. Supportive measures.   Thank you for allowing [place] to participate in the care of [name]  Wynne Dust, DNP, AGNP-C Adult & Gerontological Nurse Practitioner [facility]     LOS: 6 days    10/03/2019, 9:45 AM

## 2019-10-03 NOTE — Discharge Summary (Signed)
Physician Discharge Summary  Alexis Blankenship KYH:[id number] DOB: [birth year] DOA: 09/26/2019  PCP: [facility]  Admit date: 09/26/2019 Discharge date: 10/03/2019  Time spent: 35 minutes  Recommendations for Outpatient Follow-up:  1. Repeat CBC to follow hemoglobin and platelets count 2. Repeat basic metabolic panel to follow lites and renal function 3. Reassess patient's volume status and if needed I dose diuretics regimen.   Discharge Diagnoses:  Principal Problem:   Anasarca Active Problems:   Liver cirrhosis secondary to NASH (HCC)   Morbid obesity (HCC)   COPD (chronic obstructive pulmonary disease) (HCC)   Hypokalemia   Ascites   Discharge Condition: Stable and improved.  Discharged home with instruction to follow-up with PCP and with gastroenterology service as an outpatient.  Diet recommendation: Low sodium and low calorie diet.  Filed Weights   10/01/19 0530 10/02/19 0500 10/03/19 0500  Weight: (!) 186 kg (!) 182 kg (!) 181.5 kg    History of present illness:  As per H&P written by Dr. Robb Matar on 09/26/2019 63 y.o. female with medical history significant of osteoarthritis of the knees, COPD, morbid obesity, liver cirrhosis due to [facility] who is coming to the emergency department due to progressively worse edema for the past month or so.  She states she has not taken her medications in several months due to lack of health care insurance.  She complains of dyspnea on exertion and orthopnea.  She denies fever, chills, sore throat, rhinorrhea, chest pain, palpitations, diaphoresis, nausea, emesis, diarrhea, melena or hematochezia.  She gets constipation occasionally.  No dysuria, frequency or hematuria.  Denies polyuria, polydipsia, polyphagia or blurred vision.  ED Course: She was giving 40 mg of furosemide in the ED.  Her urinalysis shows small hemoglobinuria, but was otherwise negative.  White count is 3.3, hemoglobin 14.2 g/dL and platelets 98.  CMP  shows a potassium of 3.3 mmol/L.  All other electrolytes are within expected range, when calcium is corrected to an albumin of 2.4 g/dL.  AST was slightly elevated at 42 units/L.  The rest of the hepatic function tests are normal.  Lipase, phosphorus and magnesium were normal.  Imaging: CT abdomen shows hepatic cirrhosis with portal hypertension as evidenced by splenomegaly and upper abdominal varices.  There are gallstones and sludge in the gallbladder.  Please see images and full radiology report for further detail.  Hospital Course:  Anasarca secondary to NASH liver cirrhosis -Patient significantly improved after IV diuresis and paracentesis. -Roughly 16 L negative at time of discharge. -Advised to follow low-sodium diet -We will discharge on Lasix 40 mg daily along with the spironolactone 100 mg twice a day. -Outpatient follow-up with gastroenterology service as instructed. -Patient is oriented x3, not demonstrating any encephalopathic component. -No need of lactulose at discharge. -Daily PPI has been added to her regimen.  Morbid obesity -Body mass index is 62.67 kg/m. -Low calorie diet, portion control and increase physical activity discussed with patient.  Thrombocytopenia -Is stable -Secondary to cirrhosis -No signs of acute bleeding appreciated.  COPD with tobacco abuse -Extensive cessation counseling has been provided -Patient declined the use of nicotine patch -No wheezing or complaining of shortness of breath at this moment.  Hypokalemia  -in the setting of acute diuresis -Repleted and is stable at time of discharge -Repeat basic metabolic panel to follow electrolytes and renal function.  Procedures:  See below for x-ray reports.  Paracentesis on 09/27/2019: 4 L of ascitic fluid removed successfully.  Consultations:  Gastroenterology service  Discharge  Exam: Vitals:   10/03/19 0524 10/03/19 0741  BP: 118/64 (!) 118/56  Pulse: 86 88  Resp:  20  Temp:  98  F (36.7 C)  SpO2: 95% 96%    General: Afebrile, no chest pain, no nausea, no vomiting.  No abdominal pain and feeling ready to go home. Cardiovascular: S1 and S2, no rubs, no gallops, no murmurs. Respiratory: Good air movement bilaterally; no using accessory muscles.  Good oxygen saturation on room air. Abdomen: Bowel sounds are present, obese but soft, no tenderness on palpation; no distention. Extremities: No clubbing, trace to 1+ edema bilaterally  Discharge Instructions   Discharge Instructions    Diet - low sodium heart healthy   Complete by: As directed    Discharge instructions   Complete by: As directed    Take medications as prescribed Maintain adequate hydration Follow low-sodium diet (less than 2.5 g daily) Follow-up with gastroenterology service as instructed   Increase activity slowly   Complete by: As directed      Allergies as of 10/03/2019   No Known Allergies     Medication List    TAKE these medications   furosemide 40 MG tablet Commonly known as: Lasix Take 1 tablet (40 mg total) by mouth daily.   pantoprazole 20 MG tablet Commonly known as: Protonix Take 1 tablet (20 mg total) by mouth daily.   spironolactone 100 MG tablet Commonly known as: ALDACTONE Take 1 tablet (100 mg total) by mouth daily. Start taking on: October 04, 2019      No Known Allergies Follow-up Information    [facility] Schedule an appointment as soon as possible for a visit in 10 day(s).   Contact information: [address] [place] [address] [phone]           The results of significant diagnostics from this hospitalization (including imaging, microbiology, ancillary and laboratory) are listed below for reference.    Significant Diagnostic Studies: Ct Abdomen Pelvis W Contrast  Result Date: 09/26/2019 CLINICAL DATA:  Abdomen distension EXAM: CT ABDOMEN AND PELVIS WITH CONTRAST TECHNIQUE: Multidetector CT imaging of the  abdomen and pelvis was performed using the standard protocol following bolus administration of intravenous contrast. CONTRAST:  183mL OMNIPAQUE IOHEXOL 300 MG/ML  SOLN COMPARISON:  CT 07/28/2015 FINDINGS: Lower chest: Lung bases demonstrate no acute consolidation or pleural effusion. The heart size is within normal limits. Mitral calcification. Hepatobiliary: Lobulated liver contour consistent with cirrhosis. No focal hepatic abnormality. Multiple stones are hyperdense sludge in the gallbladder. No biliary dilatation Pancreas: Atrophic.  No inflammatory change Spleen: Enlarged measuring 17 cm craniocaudad. Adrenals/Urinary Tract: Adrenal glands are unremarkable. Kidneys are normal, without renal calculi, focal lesion, or hydronephrosis. Bladder is unremarkable. Stomach/Bowel: Stomach is within normal limits. Appendix not well seen. No evidence of bowel wall thickening, distention, or inflammatory changes. Vascular/Lymphatic: Moderate aortic atherosclerosis. No aneurysm. Subcentimeter upper abdominal and retroperitoneal nodes. Small distal esophageal and perigastric varices. Recanalized paraumbilical vein. Reproductive: Uterus and bilateral adnexa are unremarkable. Other: No free air. Moderate to large volume of ascites. Edema within the subcutaneous fat of the body wall. Musculoskeletal: No acute or suspicious osseous abnormality. IMPRESSION: 1. Hepatic cirrhosis with portal hypertension as evidenced by splenomegaly and upper abdominal varices. 2. Moderate to large volume of ascites in the abdomen 3. Gallstones are hyperdense sludge in the gallbladder Electronically Signed   By: Donavan Foil M.D.   On: 09/26/2019 22:48   US Paracentesis  Result Date: 09/27/2019 INDICATION: Ascites; distension  EXAM: ULTRASOUND GUIDED LLQ PARACENTESIS MEDICATIONS: 10 cc 1% lidocaine COMPLICATIONS: None immediate. PROCEDURE: Informed written consent was obtained from the patient after a discussion of the risks, benefits and  alternatives to treatment. A timeout was performed prior to the initiation of the procedure. Initial ultrasound scanning demonstrates a large amount of ascites within the left lower abdominal quadrant. The left lower abdomen was prepped and draped in the usual sterile fashion. 1% lidocaine was used for local anesthesia. Following this, a 15 cm 19 g Yueh catheter was introduced. An ultrasound image was saved for documentation purposes. The paracentesis was performed. The catheter was removed and a dressing was applied. The patient tolerated the procedure well without immediate post procedural complication. FINDINGS: A total of approximately 4 liters of yellow fluid was removed. Samples were sent to the laboratory as requested by the clinical team. IMPRESSION: Successful ultrasound-guided paracentesis yielding 4 liters of peritoneal fluid. Read by Robet LeuPamela A Turpin [facility] Electronically Signed   By: Ulyses SouthwardMark  Boles M.D.   On: 09/27/2019 10:12    Microbiology: Recent Results (from the past 240 hour(s))  SARS CORONAVIRUS 2 (TAT 6-24 HRS) Nasopharyngeal Nasopharyngeal Swab     Status: None   Collection Time: 09/27/19  1:16 AM   Specimen: Nasopharyngeal Swab  Result Value Ref Range Status   SARS Coronavirus 2 NEGATIVE NEGATIVE Final    Comment: (NOTE) SARS-CoV-2 target nucleic acids are NOT DETECTED. The SARS-CoV-2 RNA is generally detectable in upper and lower respiratory specimens during the acute phase of infection. Negative results do not preclude SARS-CoV-2 infection, do not rule out co-infections with other pathogens, and should not be used as the sole basis for treatment or other patient management decisions. Negative results must be combined with clinical observations, patient history, and epidemiological information. The expected result is Negative. Fact Sheet for Patients: HairSlick.nohttps://www.fda.gov/media/138098/download Fact Sheet for Healthcare Providers: quierodirigir.comhttps://www.fda.gov/media/138095/download This  test is not yet approved or cleared by the [place] FDA and  has been authorized for detection and/or diagnosis of SARS-CoV-2 by FDA under an Emergency Use Authorization (EUA). This EUA will remain  in effect (meaning this test can be used) for the duration of the COVID-19 declaration under Section 56 4(b)(1) of the Act, 21 U.S.C. section 360bbb-3(b)(1), unless the authorization is terminated or revoked sooner. Performed at [facility], [address] [place], [place] [address]   Culture, body fluid-bottle     Status: None   Collection Time: 09/27/19  9:45 AM   Specimen: Ascitic  Result Value Ref Range Status   Specimen Description ASCITIC  Final   Special Requests BOTTLES DRAWN AEROBIC AND ANAEROBIC 10CC  Final   Culture   Final    NO GROWTH 5 DAYS Performed at [facility], [address] [place], [place] [address]    Report Status 10/02/2019 FINAL  Final  Gram stain     Status: None   Collection Time: 09/27/19  9:52 AM   Specimen: Ascitic; Body Fluid  Result Value Ref Range Status   Specimen Description ASCITIC  Final   Special Requests NONE  Final   Gram Stain   Final    NO ORGANISMS SEEN WBC PRESENT, PREDOMINANTLY MONONUCLEAR CYTOSPIN SMEAR Performed at [facility], [address] [place], [place] [address]    Report Status 09/27/2019 FINAL  Final     Labs: Basic Metabolic Panel: Recent Labs  Lab 09/26/19 2115  09/28/19 0507 09/29/19 [id number] 09/30/19 0617 10/01/19 0629 10/02/19 0620 10/03/19 0637  NA 141   < >  143 143 140 143 141 141  K 3.3*   < > 3.5 3.6 3.5 3.6 3.6 3.5  CL 110   < > 111 109 108 108 107 105  CO2 24   < > GLUCOSE 94   < > 89 83 83 83 79 84  BUN 13   < > CREATININE 0.75   < > 0.82 0.75 0.76 0.76 0.79 0.81  CALCIUM 8.4*   < > 8.1* 8.0* 7.8* 8.1* 8.4* 8.3*  MG 2.1  --  1.9 1.8 1.9 2.0 2.0  --   PHOS 3.3  --   --   --   --   --   --   --    < > = values in this interval not displayed.    Liver Function Tests: Recent Labs  Lab 09/27/19 0401 09/28/19 0507 09/29/19 0626 10/01/19 0629 10/02/19 0620  AST 40 37 37 43* 48*  ALT ALKPHOS 62 56 50 48 52  BILITOT 1.3* 1.5* 1.0 0.9 1.1  PROT 5.8* 5.3* 5.1* 5.2* 5.7*  ALBUMIN 2.2* 2.0* 1.9* 2.0* 2.6*   Recent Labs  Lab 09/26/19 2115  LIPASE 24   CBC: Recent Labs  Lab 09/26/19 2115 09/27/19 0401 09/28/19 0507 09/29/19 0626 10/03/19 0637  WBC 3.3* 3.6* 3.2* 3.2* 2.8*  NEUTROABS 1.9  --   --   --   --   HGB 14.2 13.0 11.9* 11.9* 11.6*  HCT 45.2 40.5 38.0 36.4 36.6  MCV 104.6* 103.6* 103.8* 102.5* 104.3*  PLT 98* 92* 92* 84* 91*    Signed:  Vassie Loll MD.  [facility] 10/03/2019, 4:01 PM

## 2019-10-03 NOTE — Telephone Encounter (Signed)
Please schedule patient for 4 week hospital follow-up with any APP

## 2019-10-03 NOTE — Telephone Encounter (Signed)
PATIENT SCHEDULED AND LETTER SENT  °

## 2019-10-03 NOTE — Progress Notes (Signed)
Broken skin area below left scapula 2x5 mepilex dressing applied.

## 2019-10-08 NOTE — Telephone Encounter (Signed)
EGD dated 09/02/2015 by Dr. Britta Mccreedy: 2 columns of large varices in the distal esophagus, there was evidence of a red well signs.  Nonerosive gastritis found in the gastric fundus.  Patient was advised to take nadolol 40 mg daily.  Patient has upcoming OV.

## 2019-11-07 ENCOUNTER — Encounter: Payer: Self-pay | Admitting: Nurse Practitioner

## 2019-11-07 ENCOUNTER — Other Ambulatory Visit: Payer: Self-pay

## 2019-11-07 ENCOUNTER — Ambulatory Visit (INDEPENDENT_AMBULATORY_CARE_PROVIDER_SITE_OTHER): Payer: Medicaid Other | Admitting: Nurse Practitioner

## 2019-11-07 ENCOUNTER — Other Ambulatory Visit: Payer: Self-pay | Admitting: *Deleted

## 2019-11-07 ENCOUNTER — Telehealth: Payer: Self-pay | Admitting: *Deleted

## 2019-11-07 DIAGNOSIS — K7460 Unspecified cirrhosis of liver: Secondary | ICD-10-CM

## 2019-11-07 DIAGNOSIS — I8510 Secondary esophageal varices without bleeding: Secondary | ICD-10-CM

## 2019-11-07 DIAGNOSIS — R188 Other ascites: Secondary | ICD-10-CM | POA: Diagnosis not present

## 2019-11-07 DIAGNOSIS — K7581 Nonalcoholic steatohepatitis (NASH): Secondary | ICD-10-CM

## 2019-11-07 DIAGNOSIS — R601 Generalized edema: Secondary | ICD-10-CM | POA: Diagnosis not present

## 2019-11-07 DIAGNOSIS — I8500 Esophageal varices without bleeding: Secondary | ICD-10-CM | POA: Insufficient documentation

## 2019-11-07 MED ORDER — NADOLOL 40 MG PO TABS: 40.0000 mg | ORAL_TABLET | Freq: Every day | ORAL | 3 refills | Status: DC

## 2019-11-07 NOTE — Patient Instructions (Signed)
Your health issues we discussed today were:   Cirrhosis with varices (swollen blood vessels in your throat): 1. Your labs and liver imaging are up-to-date and look good 2. Your liver appears to be functioning well even though you have a lot of scarring (cirrhosis). 3. I have sent a prescription to your pharmacy for nadolol 40 mg.  Take this once a day.  Call us for any problems 4. We will schedule an upper endoscopy for you. 5. Call us if you have any worsening or severe problems  Swelling of your abdomen and lower legs: 1. As discussed, follow a low-salt diet 2. Try to keep your legs elevated when you are sitting 3. You can use compression stockings or TED hose as needed to help with swelling 4. Call us if you have any worsening swelling 5. Continue taking your diuretics as recommended  Overall I recommend:  1. Return for follow-up in 2 months 2. Call us if you have any questions or concerns 3. Continue your other current medications.   Because of recent events of COVID-19 ("Coronavirus"), follow CDC recommendations:  1. Wash your hand frequently 2. Avoid touching your face 3. Stay away from people who are sick 4. If you have symptoms such as fever, cough, shortness of breath then call your healthcare provider for further guidance 5. If you are sick, STAY AT HOME unless otherwise directed by your healthcare provider. 6. Follow directions from state and national officials regarding staying safe   At [facility] we value your feedback. You may receive a survey about your visit today. Please share your experience as we strive to create trusting relationships with our patients to provide genuine, compassionate, quality care.  We appreciate your understanding and patience as we review any laboratory studies, imaging, and other diagnostic tests that are ordered as we care for you. Our office policy is 5 business days for review of these results, and any emergent or urgent  results are addressed in a timely manner for your best interest. If you do not hear from our office in 1 week, please contact us.   We also encourage the use of MyChart, which contains your medical information for your review as well. If you are not enrolled in this feature, an access code is on this after visit summary for your convenience. Thank you for allowing [place] to be involved in your care.  It was great to see you today!  I hope you have a Merry Christmas and Happy Holidays!!

## 2019-11-07 NOTE — Telephone Encounter (Signed)
Spoke with patient. Scheduled for EGD with MAC with [facility] 1/28 at 1:30pm. Patient aware will mail instructions with pre-op/covid testing appt. Confirmed address. Orders entered.

## 2019-11-07 NOTE — Progress Notes (Signed)
Referring Provider: [facility] Primary Care Physician:  [facility] Primary GI:  Dr. Jena Gauss  NOTE: Service was provided via telemedicine and was requested by the patient due to COVID-19 pandemic.  Method of visit: Doxy.Me  Patient Location: Home  Provider Location: Office  Reason for Phone Visit: Hospital Follow-up  The patient was consented to phone follow-up via telephone encounter including billing of the encounter (yes/no): Yes  Persons present on the phone encounter, with roles: Son  Total time (minutes) spent on medical discussion: 21 minutes  Chief Complaint  Patient presents with  . hfu    no swelling, no abd pain. Taking meds as directed    HPI:   Alexis Blankenship is a 63 y.o. female who presents for virtual visit regarding: Hospital follow-up.  The patient was admitted to the hospital from 09/26/2019 through 10/03/2019.  The patient has a significant history for osteoarthritis of the knees, COPD, morbid obesity, liver cirrhosis due to [facility] who presented to the emergency department with progressively worse edema.  Has not taken medications in several months due to lack of healthcare insurance.  Having dyspnea on exertion and orthopnea.  She was given 40 mg of Lasix in the ED.  CBC essentially normal.  CMP shows potassium of 3.3.  AST slightly elevated at 42 and remaining LFTs normal.  Lipase also normal.  CT of the abdomen showed hepatic cirrhosis with portal hypertension with splenomegaly and upper abdominal varices.  Gallstones and sludge in the gallbladder.  She was admitted for work-up and treatment of anasarca secondary to [facility] liver cirrhosis.  During her admission she improved significantly with IV diuresis and paracentesis with roughly 16 L negative at the time of discharge.  Advised to follow low-salt diet, discharged on Lasix 40 mg daily and spironolactone 100 mg twice a day.  Outpatient follow-up with GI.  No signs of hepatic  encephalopathy and no lactulose at discharge.  Daily PPI also added to her regimen.  Last EGD found in our system dated 09/02/2015 which was completed at [facility].  Findings included 2 columns of large varices in the distal esophagus with evidence of a red wale sign.  Nonerosive gastritis.  Normal duodenum.  Recommended nadolol 40 mg a day, Pepcid 20 mg a day.  It does not appear that she was taking nadolol at the time that we encountered her in the hospital.  Today she states she's doing ok overall. Feeling a lot better. Swelling is gone, abdominal pain resolved. Taking diuretics once daily. Does remember being started on beta blocker, not currently on it because Dr. Teena Dunk left and couldn't get refills. Last week had minimal nausea, has since resolved. Denies vomiting. Denies hematochezia, melena, fever, chills, unintentional weight loss. Denies yellowing of skin/eyes, darkened urine, acute episodic confusion. Has noticed she's had some hand shaking. Denies generalized pruritis. Denies URI or flu-like symptoms. Denies loss of sense of taste or smell. Denies chest pain, dyspnea, dizziness, lightheadedness, syncope, near syncope. Denies any other upper or lower GI symptoms.   Past Medical History:  Diagnosis Date  . Arthritis    in knees  . COPD (chronic obstructive pulmonary disease) (HCC)   . 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 Teena Dunk  . Morbid obesity (HCC)     Past Surgical History:  Procedure Laterality Date  . CESAREAN SECTION    . ESOPHAGOGASTRODUODENOSCOPY     Dr. Teena Dunk  Current Outpatient Medications  Medication Sig Dispense Refill  . furosemide (LASIX) 40 MG tablet Take 1 tablet (40 mg total) by mouth daily. 30 tablet 2  . pantoprazole (PROTONIX) 20 MG tablet Take 1 tablet (20 mg total) by mouth daily. 30 tablet 2  . spironolactone (ALDACTONE) 100 MG tablet Take 1 tablet (100 mg total) by mouth daily.  60 tablet 2   No current facility-administered medications for this visit.     Allergies as of 11/07/2019  . (No Known Allergies)    Family History  Problem Relation Age of Onset  . Diabetes Mother   . Diabetes Father   . Heart disease Father   . Diabetes Sister   . Diabetes Brother   . Liver cancer Neg Hx   . Liver disease Neg Hx   . Colon cancer Neg Hx     Social History   Socioeconomic History  . Marital status: Single    Spouse name: Not on file  . Number of children: Not on file  . Years of education: Not on file  . Highest education level: Not on file  Occupational History  . Not on file  Social Needs  . Financial resource strain: Not on file  . Food insecurity    Worry: Not on file    Inability: Not on file  . Transportation needs    Medical: Not on file    Non-medical: Not on file  Tobacco Use  . Smoking status: Current Every Day Smoker    Packs/day: 0.25    Types: Cigarettes  . Smokeless tobacco: Never Used  Substance and Sexual Activity  . Alcohol use: Never    Frequency: Never  . Drug use: Never  . Sexual activity: Not on file  Lifestyle  . Physical activity    Days per week: Not on file    Minutes per session: Not on file  . Stress: Not on file  Relationships  . Social Herbalist on phone: Not on file    Gets together: Not on file    Attends religious service: Not on file    Active member of club or organization: Not on file    Attends meetings of clubs or organizations: Not on file    Relationship status: Not on file  Other Topics Concern  . Not on file  Social History Narrative  . Not on file    Review of Systems: General: Negative for anorexia, weight loss, fever, chills, fatigue, weakness. Eyes: Negative for vision changes.  ENT: Negative for hoarseness, difficulty swallowing. CV: Negative for chest pain, angina, palpitations, peripheral edema.  Respiratory: Negative for dyspnea at rest, cough, sputum, wheezing.  GI:  See history of present illness. MS: Negative for joint pain, low back pain.  Derm: Negative for rash or itching.  Neuro: Negative for memory loss, confusion.  Endo: Negative for unusual weight change.  Heme: Negative for bruising or bleeding. Allergy: Negative for rash or hives.  Physical Exam: Note: limited exam due to virtual visit General:   Alert and oriented. Pleasant and cooperative. Well-nourished and well-developed.  Head:  Normocephalic and atraumatic. Eyes:  Without icterus, sclera clear and conjunctiva pink.  Ears:  Normal auditory acuity. Skin:  Intact without facial significant lesions or rashes. Neurologic:  Alert and oriented x4;  grossly normal neurologically. Psych:  Alert and cooperative. Normal mood and affect. Heme/Lymph/Immune: No excessive bruising noted.

## 2019-11-07 NOTE — Assessment & Plan Note (Signed)
Acute inpatient hospitalization for anasarca and ascites.  She underwent paracentesis which was successful.  No obvious reaccumulation.  No need for ongoing paracentesis.  She is taking her diuretics as recommended and has had good luck with these and no further lower extremity edema.  Recommend she continue her current medications and follow-up in 2 months.

## 2019-11-07 NOTE — Assessment & Plan Note (Signed)
Previous EGD on file 2016 from [place] with Dr. Britta Mccreedy found 2 columns of large esophageal varices with red well sign present.  She started on nadolol 40 mg daily.  She has not taken this in 2 years because her previous gastroenterologist retired and has not been able to get refills.  At this point her blood pressure has lost weight over him, in the hospital is 118/56.  At home today was 129/69.  Her heart rate ranging from 80-94.  I will start her on initial dose of nadolol 40 mg once daily.  Return for follow-up in 2 months.  We will plan for an upper endoscopy to further evaluate for esophageal varices.  Query possible need for banding.  Proceed with EGD on propofol/MAC with Dr. Gala Romney in near future: the risks, benefits, and alternatives have been discussed with the patient in detail. The patient states understanding and desires to proceed.  The patient does not on any anticoagulants, anxiolytics, chronic pain medications, antidepressants, antidiabetics, or iron supplements.  Denies alcohol and drug use.  Her BMI is about 54 per hospital records.  Due to elevated BMI we will plan for the procedure on propofol/MAC

## 2019-11-07 NOTE — Assessment & Plan Note (Signed)
Recent admission for anasarca and ascites.  Successful IV diuresis during hospitalization.  Continues on oral diuretics as recommended.  She has had essentially complete resolution of her edema.  She is on a low-salt diet.  She denies any further abdominal reaccumulation or lower extremity reaccumulation of fluid.  Recommend she continue to monitor, continue precautions previously discussed including low-salt diet, diuretics, elevation of extremities, TED hose.  Call for any worsening edema.  Follow-up in 2 months.

## 2019-11-07 NOTE — Assessment & Plan Note (Addendum)
Alexis Blankenship cirrhosis with known history of esophageal varices who presented with anasarca and ascites.  Status post paracentesis and IV diuresis during hospitalization with significant improvement.  She states she has not had any further swelling.  She remains on Lasix 40 mg and spironolactone 100 mg both once daily.  No further abdominal swelling.  Recommend she continue to monitor for any worsening swelling and call us.  Her LFTs were recently drawn during her hospitalization in October with MELD 8, Child Pugh B.  She is currently due for an EGD.  She needs beta-blockade due to history of varices, as per below.  Recent CT imaging without suspicious lesions of the liver but noted lobulated contour consistent with cirrhosis.  Continue diuretics, lower extremity elevation, low-salt diet, TED hose as needed.  Follow-up in 2 months.

## 2019-11-08 ENCOUNTER — Encounter: Payer: Self-pay | Admitting: *Deleted

## 2019-11-08 ENCOUNTER — Encounter: Payer: Self-pay | Admitting: Nurse Practitioner

## 2019-11-19 ENCOUNTER — Telehealth: Payer: Self-pay

## 2019-11-19 NOTE — Telephone Encounter (Signed)
Left a detailed message for pt. Wanting to see if pt has tried and failed medications prior to Nadolol being prescribed by EG. Waiting on a return call.

## 2019-12-17 ENCOUNTER — Telehealth: Payer: Self-pay | Admitting: *Deleted

## 2019-12-17 NOTE — Telephone Encounter (Signed)
Called patient to move procedure time up on 01/02/2019. Patient wanted to r/s d/t transportation issuess.Patient has rescheduled to 3/15 at 2:15pm. Aware will mail new instructions with new pre-op/covid test appt. Confirmed address. Called endo and LMOVM making aware

## 2019-12-18 ENCOUNTER — Encounter: Payer: Self-pay | Admitting: *Deleted

## 2020-01-01 ENCOUNTER — Encounter (HOSPITAL_COMMUNITY): Payer: Medicaid Other

## 2020-01-01 ENCOUNTER — Other Ambulatory Visit (HOSPITAL_COMMUNITY): Payer: Medicaid Other

## 2020-01-09 ENCOUNTER — Encounter: Payer: Self-pay | Admitting: Internal Medicine

## 2020-01-09 ENCOUNTER — Telehealth: Payer: Self-pay | Admitting: Internal Medicine

## 2020-01-09 ENCOUNTER — Ambulatory Visit: Payer: Medicaid Other | Admitting: Nurse Practitioner

## 2020-01-09 NOTE — Progress Notes (Deleted)
Referring Provider: [facility] Primary Care Physician:  [facility]:  Dr. Gala Romney  No chief complaint on file.   HPI:   Alexis Blankenship is a 64 y.o. female who presents for follow-up on cirrhosis and esophageal varices.  Patient was last seen in our office 11/08/2019 for cirrhosis, anasarca, ascites, varices.  Her last visit was a virtual office visit due to COVID-19/coronavirus pandemic.  This is a hospital follow-up visit.  Recent hospital admission 09/26/2019 through 10/03/2019 with progressively worsening edema.  Not on medications for several months due to lack of health insurance.  AST slightly elevated in the hospital at 11 and remaining LFTs normal.  CT of the abdomen showed hepatic cirrhosis with portal hypertension with splenomegaly and upper abdominal varices.  Admitted for treatment of anasarca secondary to [facility] liver cirrhosis.  Significant improvement with IV diuresis and paracentesis with 16 L negative at the time of discharge.  Advised low-salt diet, Lasix 40 mg daily and spironolactone 100 mg daily, outpatient follow-up.  No signs of hepatic encephalopathy and no lactulose at discharge.  Most recent EGD in 2016 at [facility] with 2 columns of large varices in the distal esophagus with evidence of red wale sign.  Recommended nadolol 40 mg a day.  It does not appear she was taking nadolol at the time that we encountered her in the hospital.  At her last visit she was feeling a lot better, swelling gone and abdominal pain resolved.  Taking diuretics daily.  Does remember taking beta-blockers but was not at that time because her GI provider left and she can get refills.  No other overt GI or hepatic complaints at that time.  Recommended nadolol 40 mg once a day, surveillance EGD, low-salt diet, other measures to prevent worsening lower extremity edema, continue diuretics, follow-up in 2 months.  EGD was initially scheduled  for 01/03/2020 but this had to be rescheduled to 02/18/2020 due 2 patient transportation issues.  Instructions were updated and mailed to patient.  Today she states   Past Medical History:  Diagnosis Date  . Arthritis    in knees  . COPD (chronic obstructive pulmonary disease) ([place])   . 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 Britta Mccreedy  . Morbid obesity ([place])     Past Surgical History:  Procedure Laterality Date  . CESAREAN SECTION    . ESOPHAGOGASTRODUODENOSCOPY     Dr. Britta Mccreedy    Current Outpatient Medications  Medication Sig Dispense Refill  . furosemide (LASIX) 40 MG tablet Take 1 tablet (40 mg total) by mouth daily. 30 tablet 2  . nadolol (CORGARD) 40 MG tablet Take 1 tablet (40 mg total) by mouth daily. 30 tablet 3  . pantoprazole (PROTONIX) 20 MG tablet Take 1 tablet (20 mg total) by mouth daily. 30 tablet 2  . spironolactone (ALDACTONE) 100 MG tablet Take 1 tablet (100 mg total) by mouth daily. 60 tablet 2   No current facility-administered medications for this visit.    Allergies as of 01/09/2020  . (No Known Allergies)    Family History  Problem Relation Age of Onset  . Diabetes Mother   . Diabetes Father   . Heart disease Father   . Diabetes Sister   . Diabetes Brother   . Liver cancer Neg Hx   . Liver disease Neg Hx   . Colon cancer Neg Hx  Social History   Socioeconomic History  . Marital status: Single    Spouse name: Not on file  . Number of children: Not on file  . Years of education: Not on file  . Highest education level: Not on file  Occupational History  . Not on file  Tobacco Use  . Smoking status: Current Every Day Smoker    Packs/day: 0.25    Types: Cigarettes  . Smokeless tobacco: Never Used  Substance and Sexual Activity  . Alcohol use: Never  . Drug use: Never  . Sexual activity: Not on file  Other Topics Concern  . Not on file  Social History Narrative  . Not on file    Social Determinants of Health   Financial Resource Strain:   . Difficulty of Paying Living Expenses: Not on file  Food Insecurity:   . Worried About Programme researcher, broadcasting/film/video in the Last Year: Not on file  . Ran Out of Food in the Last Year: Not on file  Transportation Needs:   . Lack of Transportation (Medical): Not on file  . Lack of Transportation (Non-Medical): Not on file  Physical Activity:   . Days of Exercise per Week: Not on file  . Minutes of Exercise per Session: Not on file  Stress:   . Feeling of Stress : Not on file  Social Connections:   . Frequency of Communication with Friends and Family: Not on file  . Frequency of Social Gatherings with Friends and Family: Not on file  . Attends Religious Services: Not on file  . Active Member of Clubs or Organizations: Not on file  . Attends Banker Meetings: Not on file  . Marital Status: Not on file    Review of Systems: General: Negative for anorexia, weight loss, fever, chills, fatigue, weakness. Eyes: Negative for vision changes.  ENT: Negative for hoarseness, difficulty swallowing , nasal congestion. CV: Negative for chest pain, angina, palpitations, dyspnea on exertion, peripheral edema.  Respiratory: Negative for dyspnea at rest, dyspnea on exertion, cough, sputum, wheezing.  GI: See history of present illness. GU:  Negative for dysuria, hematuria, urinary incontinence, urinary frequency, nocturnal urination.  MS: Negative for joint pain, low back pain.  Derm: Negative for rash or itching.  Neuro: Negative for weakness, abnormal sensation, seizure, frequent headaches, memory loss, confusion.  Psych: Negative for anxiety, depression, suicidal ideation, hallucinations.  Endo: Negative for unusual weight change.  Heme: Negative for bruising or bleeding. Allergy: Negative for rash or hives.   Physical Exam: There were no vitals taken for this visit. General:   Alert and oriented. Pleasant and cooperative.  Well-nourished and well-developed.  Head:  Normocephalic and atraumatic. Eyes:  Without icterus, sclera clear and conjunctiva pink.  Ears:  Normal auditory acuity. Mouth:  No deformity or lesions, oral mucosa pink.  Throat/Neck:  Supple, without mass or thyromegaly. Cardiovascular:  S1, S2 present without murmurs appreciated. Normal pulses noted. Extremities without clubbing or edema. Respiratory:  Clear to auscultation bilaterally. No wheezes, rales, or rhonchi. No distress.  Gastrointestinal:  +BS, soft, non-tender and non-distended. No HSM noted. No guarding or rebound. No masses appreciated.  Rectal:  Deferred  Musculoskalatal:  Symmetrical without gross deformities. Normal posture. Skin:  Intact without significant lesions or rashes. Neurologic:  Alert and oriented x4;  grossly normal neurologically. Psych:  Alert and cooperative. Normal mood and affect. Heme/Lymph/Immune: No significant cervical adenopathy. No excessive bruising noted.    01/09/2020 1:25 PM   Disclaimer: This note was  dictated with voice recognition software. Similar sounding words can inadvertently be transcribed and may not be corrected upon review.

## 2020-01-09 NOTE — Telephone Encounter (Signed)
Patient was a no show and letter sent  °

## 2020-01-14 ENCOUNTER — Telehealth: Payer: Self-pay | Admitting: Internal Medicine

## 2020-01-14 NOTE — Telephone Encounter (Signed)
Pt called to confirm her dates and times for her upcoming procedure, pre op and covid test. I am mailing her instructions to her. She asked if the nurse could call her sometime today regarding her depression and anxiety. She was asking if we could prescribe something for her. I told her we don't normally prescription for something like that and she should call her PCP. She still wants the nurse to call her. [phone]

## 2020-01-14 NOTE — Telephone Encounter (Signed)
Pt returned call and asked for medication for her anxiety. Pt isn't able to sit out side anymore like she use to due to her anxiety. Pt hasn't hasn't discussed this with her PCP. Pt is aware that our office doesn't call in medication for anxiety. Please advise.

## 2020-01-14 NOTE — Telephone Encounter (Signed)
Lmom, waiting on a return call.  

## 2020-01-15 NOTE — Telephone Encounter (Signed)
Should follow-up with PCP for evaluation and management of anxiety.

## 2020-01-16 NOTE — Telephone Encounter (Signed)
Lmom, waiting on a return call.  

## 2020-02-11 ENCOUNTER — Telehealth: Payer: Self-pay | Admitting: *Deleted

## 2020-02-11 NOTE — Patient Instructions (Signed)
20    Your procedure is scheduled on: 02/18/2020  Report to [facility] at     12:30 PM.  Call this number if you have problems the morning of surgery: [phone]   Remember:   Do not drink or eat food:After Midnight.        No Smoking the day of procedure      Take these medicines the morning of surgery with A SIP OF WATER: Nadolol, pantoprazole and spironolactone   Do not wear jewelry, make-up or nail polish.  Do not wear lotions, powders, or perfumes. You may wear deodorant.                Do not bring valuables to the hospital.  Contacts, dentures or bridgework may not be worn into surgery.  Leave suitcase in the car. After surgery it may be brought to your room.  For patients admitted to the hospital, checkout time is 11:00 AM the day of discharge.   Patients discharged the day of surgery will not be allowed to drive home. Upper Endoscopy, Adult Upper endoscopy is a procedure to look inside the upper GI (gastrointestinal) tract. The upper GI tract is made up of:  The part of the body that moves food from your mouth to your stomach (esophagus).  The stomach.  The first part of your small intestine (duodenum). This procedure is also called esophagogastroduodenoscopy (EGD) or gastroscopy. In this procedure, your health care provider passes a thin, flexible tube (endoscope) through your mouth and down your esophagus into your stomach. A small camera is attached to the end of the tube. Images from the camera appear on a monitor in the exam room. During this procedure, your health care provider may also remove a small piece of tissue to be sent to a lab and examined under a microscope (biopsy). Your health care provider may do an upper endoscopy to diagnose cancers of the upper GI tract. You may also have this procedure to find the cause of other conditions, such as:  Stomach pain.  Heartburn.  Pain or problems when swallowing.  Nausea and vomiting.  Stomach bleeding.  Stomach  ulcers. Tell a health care provider about:  Any allergies you have.  All medicines you are taking, including vitamins, herbs, eye drops, creams, and over-the-counter medicines.  Any problems you or family members have had with anesthetic medicines.  Any blood disorders you have.  Any surgeries you have had.  Any medical conditions you have.  Whether you are pregnant or may be pregnant. What are the risks? Generally, this is a safe procedure. However, problems may occur, including:  Infection.  Bleeding.  Allergic reactions to medicines.  A tear or hole (perforation) in the esophagus, stomach, or duodenum. What happens before the procedure? Staying hydrated Follow instructions from your health care provider about hydration, which may include:  Up to 2 hours before the procedure - you may continue to drink clear liquids, such as water, clear fruit juice, black coffee, and plain tea.  Eating and drinking restrictions Follow instructions from your health care provider about eating and drinking, which may include:  8 hours before the procedure - stop eating heavy meals or foods, such as meat, fried foods, or fatty foods.  6 hours before the procedure - stop eating light meals or foods, such as toast or cereal.  6 hours before the procedure - stop drinking milk or drinks that contain milk.  2 hours before the procedure - stop drinking clear  liquids. Medicines Ask your health care provider about:  Changing or stopping your regular medicines. This is especially important if you are taking diabetes medicines or blood thinners.  Taking medicines such as aspirin and ibuprofen. These medicines can thin your blood. Do not take these medicines unless your health care provider tells you to take them.  Taking over-the-counter medicines, vitamins, herbs, and supplements. General instructions  Plan to have someone take you home from the hospital or clinic.  If you will be going  home right after the procedure, plan to have someone with you for 24 hours.  Ask your health care provider what steps will be taken to help prevent infection. What happens during the procedure?  1. An IV will be inserted into one of your veins. 2. You may be given one or more of the following: ? A medicine to help you relax (sedative). ? A medicine to numb the throat (local anesthetic). 3. You will lie on your left side on an exam table. 4. Your health care provider will pass the endoscope through your mouth and down your esophagus. 5. Your health care provider will use the scope to check the inside of your esophagus, stomach, and duodenum. Biopsies may be taken. 6. The endoscope will be removed. The procedure may vary among health care providers and hospitals. What happens after the procedure?  Your blood pressure, heart rate, breathing rate, and blood oxygen level will be monitored until you leave the hospital or clinic.  Do not drive for 24 hours if you were given a sedative during your procedure.  When your throat is no longer numb, you may be given some fluids to drink.  It is up to you to get the results of your procedure. Ask your health care provider, or the department that is doing the procedure, when your results will be ready. Summary  Upper endoscopy is a procedure to look inside the upper GI tract.  During the procedure, an IV will be inserted into one of your veins. You may be given a medicine to help you relax.  A medicine will be used to numb your throat.  The endoscope will be passed through your mouth and down your esophagus. This information is not intended to replace advice given to you by your health care provider. Make sure you discuss any questions you have with your health care provider. Document Revised: 05/17/2018 Document Reviewed: 04/24/2018 Elsevier Patient Education  [place] After  Please read the instructions outlined below and refer to this sheet in the next few weeks. These discharge instructions provide you with general information on caring for yourself after you leave the hospital. Your doctor may also give you specific instructions.  While your treatment has been planned according to the most current medical practices available, unavoidable complications occasionally occur. If you have any problems or questions after discharge, please call your doctor. HOME CARE INSTRUCTIONS Activity  You may resume your regular activity but move at a slower pace for the next 24 hours.   Take frequent rest periods for the next 24 hours.   Walking will help expel (get rid of) the air and reduce the bloated feeling in your abdomen.   No driving for 24 hours (because of the anesthesia (medicine) used during the test).   You may shower.   Do not sign any important legal documents or operate any machinery for 24 hours (because of the anesthesia used during the test).  Nutrition  Drink plenty of fluids.   You may resume your normal diet.   Begin with a light meal and progress to your normal diet.   Avoid alcoholic beverages for 24 hours or as instructed by your caregiver.  Medications You may resume your normal medications unless your caregiver tells you otherwise. What you can expect today  You may experience abdominal discomfort such as a feeling of fullness or "gas" pains.   You may experience a sore throat for 2 to 3 days. This is normal. Gargling with salt water may help this.  Follow-up Your doctor will discuss the results of your test with you. SEEK IMMEDIATE MEDICAL CARE IF:  You have excessive nausea (feeling sick to your stomach) and/or vomiting.   You have severe abdominal pain and distention (swelling).   You have trouble swallowing.   You have a temperature over 100  F (37.8 C).   You have rectal bleeding or vomiting of blood.  Document Released: 07/06/2004 Document Revised: 11/11/2011 Document Reviewed: 01/17/2008  Esophageal Varices  Esophageal varices are enlarged veins in the part of the body that moves food from the mouth to the stomach (esophagus). They develop when extra blood is forced to flow through these veins because the blood's normal pathway is blocked. Without treatment, esophageal varices eventually break and bleed (hemorrhage), which can be life-threatening. What are the causes? This condition may be caused by:  Scarring of the liver (cirrhosis) due to alcoholism. This is the most common cause.  Long-term (chronic) liver disease.  Severe heart failure.  A blood clot in a vein that supplies the liver (portal vein).  A disease that causes inflammation in the organs and other body areas (sarcoidosis).  A parasitic infection that can cause liver damage (schistosomiasis). What are the signs or symptoms? Esophageal varices usually do not cause symptoms unless they start to bleed. Symptoms of bleeding esophageal varices include:  Vomiting material that is bright red or that is black and looks like coffee grounds.  Coughing up blood.  Stools (feces) that look black and tarry.  Dizziness or light-headedness.  Low blood pressure.  Loss of consciousness. How is this diagnosed? This condition is diagnosed with a procedure called endoscopy. During endoscopy, your health care provider uses a flexible tube with a small camera on the end of it (endoscope) to look down your throat and examine your esophagus. You may also have other tests, including:  Imaging tests such as a CT scan or ultrasound.  Blood tests. How is this treated? This condition may be treated with medicines or procedures that reduce pressure in the varices and reduce the risk of bleeding. Medicines are usually used for varices that are not bleeding. Procedures that  may be done  for bleeding varices include:  Placing an elastic band around the varices to keep them from bleeding (variceal ligation).  Replacing blood that you have lost due to bleeding. This may include getting a transfusion of blood or parts of blood, such as platelets or clotting factors.  You may be given antibiotic medicine to help prevent infection.  Getting an injection that causes the varices to shrink and close (sclerotherapy). You may also be given medicines that tighten (constrict) blood vessels or change blood flow.  Placing a tube into your esophagus and then passing a balloon through the tube and inflating the balloon (balloon tamponade). The balloon applies pressure to the bleeding veins to help stop the bleeding.  Placing a small tube within the veins in the liver (transjugular intrahepatic portosystemic shunt, TIPS). This decreases blood flow and pressure in the esophageal varices. If other treatments do not work, you may need a liver transplant. Follow these instructions at home:  Take over-the-counter and prescription medicines only as told by your health care provider.  If you were prescribed an antibiotic medicine, take it as told by your health care provider. Do not stop taking the antibiotic even if you start to feel better.  Do not take any NSAIDs (such as aspirin or ibuprofen) before first getting approval from your health care provider.  Do not drink alcohol.  Return to your normal activities as told by your health care provider. Ask your health care provider what activities are safe for you.  Keep all follow-up visits as told by your health care provider. This is important. Contact a health care provider if:  You have abdominal pain.  You are unable to eat or drink. Get help right away if:  You have blood in your stool or vomit.  You have stools that look black or tarry.  You have chest pain.  You feel dizzy or have low blood pressure.  You lose  consciousness. These symptoms may represent a serious problem that is an emergency. Do not wait to see if the symptoms will go away. Get medical help right away. Call your local emergency services (911 in the [place] Do not drive yourself to the hospital. Summary  Esophageal varices are enlarged veins in the esophagus, the part of your body that moves food from your mouth to your stomach.  Without treatment, esophageal varices eventually break and bleed (hemorrhage), which can be life-threatening.  Esophageal varices usually do not cause symptoms unless they start to bleed.  Keep all follow-up visits as told by your health care provider. This is important. This information is not intended to replace advice given to you by your health care provider. Make sure you discuss any questions you have with your health care provider. Document Revised: 11/04/2017 Document Reviewed: 08/24/2017 Elsevier Patient Education  [place].

## 2020-02-11 NOTE — Telephone Encounter (Signed)
Spoke with pt. She needs to cancel procedure. Her granddaughter was in a car wreck and she can't do procedure right now. She will call back to r/s. Endo aware.

## 2020-02-12 NOTE — Telephone Encounter (Signed)
Noted  

## 2020-02-15 ENCOUNTER — Encounter (HOSPITAL_COMMUNITY): Payer: Self-pay

## 2020-02-15 ENCOUNTER — Other Ambulatory Visit (HOSPITAL_COMMUNITY): Admission: RE | Admit: 2020-02-15 | Payer: Medicaid Other | Source: Ambulatory Visit

## 2020-02-15 ENCOUNTER — Encounter (HOSPITAL_COMMUNITY)
Admission: RE | Admit: 2020-02-15 | Discharge: 2020-02-15 | Disposition: A | Discharge status: Home / Self Care | Payer: Medicaid Other | Source: Ambulatory Visit | Attending: Internal Medicine | Admitting: Internal Medicine

## 2020-02-18 ENCOUNTER — Ambulatory Visit (HOSPITAL_COMMUNITY): Admit: 2020-02-18 | Payer: Medicaid Other | Admitting: Internal Medicine

## 2020-02-18 ENCOUNTER — Encounter (HOSPITAL_COMMUNITY): Payer: Self-pay

## 2020-02-18 SURGERY — ESOPHAGOGASTRODUODENOSCOPY (EGD) WITH PROPOFOL
Anesthesia: Monitor Anesthesia Care

## 2020-09-14 ENCOUNTER — Other Ambulatory Visit: Payer: Self-pay

## 2020-09-14 ENCOUNTER — Inpatient Hospital Stay (HOSPITAL_COMMUNITY)
Admission: EM | Admit: 2020-09-14 | Discharge: 2020-09-18 | DRG: 641 | Disposition: A | Discharge status: Home / Self Care | Payer: Medicaid Other | Attending: Internal Medicine | Admitting: Internal Medicine

## 2020-09-14 ENCOUNTER — Encounter (HOSPITAL_COMMUNITY): Payer: Self-pay | Admitting: Emergency Medicine

## 2020-09-14 DIAGNOSIS — F17210 Nicotine dependence, cigarettes, uncomplicated: Secondary | ICD-10-CM | POA: Diagnosis present

## 2020-09-14 DIAGNOSIS — Z20822 Contact with and (suspected) exposure to covid-19: Secondary | ICD-10-CM | POA: Diagnosis present

## 2020-09-14 DIAGNOSIS — S81801A Unspecified open wound, right lower leg, initial encounter: Secondary | ICD-10-CM

## 2020-09-14 DIAGNOSIS — Z720 Tobacco use: Secondary | ICD-10-CM

## 2020-09-14 DIAGNOSIS — K7581 Nonalcoholic steatohepatitis (NASH): Secondary | ICD-10-CM

## 2020-09-14 DIAGNOSIS — Z6841 Body Mass Index (BMI) 40.0 and over, adult: Secondary | ICD-10-CM

## 2020-09-14 DIAGNOSIS — R188 Other ascites: Secondary | ICD-10-CM

## 2020-09-14 DIAGNOSIS — M170 Bilateral primary osteoarthritis of knee: Secondary | ICD-10-CM | POA: Diagnosis present

## 2020-09-14 DIAGNOSIS — D7589 Other specified diseases of blood and blood-forming organs: Secondary | ICD-10-CM

## 2020-09-14 DIAGNOSIS — Z833 Family history of diabetes mellitus: Secondary | ICD-10-CM

## 2020-09-14 DIAGNOSIS — Z8249 Family history of ischemic heart disease and other diseases of the circulatory system: Secondary | ICD-10-CM

## 2020-09-14 DIAGNOSIS — D708 Other neutropenia: Secondary | ICD-10-CM

## 2020-09-14 DIAGNOSIS — I8500 Esophageal varices without bleeding: Secondary | ICD-10-CM | POA: Diagnosis present

## 2020-09-14 DIAGNOSIS — K7460 Unspecified cirrhosis of liver: Secondary | ICD-10-CM

## 2020-09-14 DIAGNOSIS — D696 Thrombocytopenia, unspecified: Secondary | ICD-10-CM

## 2020-09-14 DIAGNOSIS — I8510 Secondary esophageal varices without bleeding: Secondary | ICD-10-CM

## 2020-09-14 DIAGNOSIS — R601 Generalized edema: Secondary | ICD-10-CM

## 2020-09-14 DIAGNOSIS — J449 Chronic obstructive pulmonary disease, unspecified: Secondary | ICD-10-CM | POA: Diagnosis present

## 2020-09-14 DIAGNOSIS — S80821A Blister (nonthermal), right lower leg, initial encounter: Secondary | ICD-10-CM | POA: Diagnosis present

## 2020-09-14 DIAGNOSIS — E8770 Fluid overload, unspecified: Principal | ICD-10-CM | POA: Diagnosis present

## 2020-09-14 LAB — CBC
HCT: 39.5 % (ref 36.0–46.0)
Hemoglobin: 13.1 g/dL (ref 12.0–15.0)
MCH: 34.7 pg — ABNORMAL HIGH (ref 26.0–34.0)
MCHC: 33.2 g/dL (ref 30.0–36.0)
MCV: 104.5 fL — ABNORMAL HIGH (ref 80.0–100.0)
Platelets: 92 10*3/uL — ABNORMAL LOW (ref 150–400)
RBC: 3.78 MIL/uL — ABNORMAL LOW (ref 3.87–5.11)
RDW: 16.1 % — ABNORMAL HIGH (ref 11.5–15.5)
WBC: 3.3 10*3/uL — ABNORMAL LOW (ref 4.0–10.5)
nRBC: 0 % (ref 0.0–0.2)

## 2020-09-14 LAB — BASIC METABOLIC PANEL
Anion gap: 5 (ref 5–15)
BUN: 12 mg/dL (ref 8–23)
CO2: 27 mmol/L (ref 22–32)
Calcium: 8.2 mg/dL — ABNORMAL LOW (ref 8.9–10.3)
Chloride: 107 mmol/L (ref 98–111)
Creatinine, Ser: 0.87 mg/dL (ref 0.44–1.00)
GFR, Estimated: >60 mL/min (ref >60)
Glucose, Bld: 96 mg/dL (ref 70–99)
Potassium: 3.6 mmol/L (ref 3.5–5.1)
Sodium: 139 mmol/L (ref 135–145)

## 2020-09-14 LAB — LIPASE, BLOOD: Lipase: 28 U/L (ref 11–51)

## 2020-09-14 LAB — BRAIN NATRIURETIC PEPTIDE: B Natriuretic Peptide: 78 pg/mL (ref 0.0–100.0)

## 2020-09-14 MED ORDER — FUROSEMIDE 10 MG/ML IJ SOLN
40.0000 mg | Freq: Once | INTRAMUSCULAR | Status: AC
  Administered 2020-09-15: 40 mg via INTRAVENOUS
  Filled 2020-09-14: qty 4

## 2020-09-14 MED ORDER — SPIRONOLACTONE 25 MG PO TABS
100.0000 mg | ORAL_TABLET | Freq: Once | ORAL | Status: DC
  Filled 2020-09-14: qty 1

## 2020-09-14 NOTE — ED Triage Notes (Signed)
Pt c/o of mild abdominal pain. Pt has liver failure and hasn't taken her fluid pill since january. Ascites noted.

## 2020-09-14 NOTE — ED Provider Notes (Signed)
[facility] EMERGENCY DEPARTMENT Provider Note   CSN: [id number] Arrival date & time: 09/14/20  1705   History Chief Complaint  Patient presents with  . Abdominal Pain    Alexis Blankenship is a 64 y.o. female.  The history is provided by the patient.  Abdominal Pain She has a history of NASH, and comes in with progressive swelling. Today, she noted blisters on her right leg. She denies abdominal pain, chest pain, leg pain, dyspnea. She ran out of her diuretic about one month ago, but did not call her physician to get the prescription refilled.  Past Medical History:  Diagnosis Date  . Arthritis    in knees  . COPD (chronic obstructive pulmonary disease) (HCC)   . 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 Teena Dunk  . Morbid obesity [facility])     Patient Active Problem List   Diagnosis Date Noted  . Esophageal varices (HCC) 11/07/2019  . Ascites   . Anasarca 09/26/2019  . Liver cirrhosis secondary to NASH (HCC) 09/26/2019  . Morbid obesity (HCC)   . COPD (chronic obstructive pulmonary disease) (HCC)   . Hypokalemia     Past Surgical History:  Procedure Laterality Date  . CESAREAN SECTION    . ESOPHAGOGASTRODUODENOSCOPY     Dr. Teena Dunk     OB History   No obstetric history on file.     Family History  Problem Relation Age of Onset  . Diabetes Mother   . Diabetes Father   . Heart disease Father   . Diabetes Sister   . Diabetes Brother   . Liver cancer Neg Hx   . Liver disease Neg Hx   . Colon cancer Neg Hx     Social History   Tobacco Use  . Smoking status: Current Every Day Smoker    Packs/day: 0.25    Types: Cigarettes  . Smokeless tobacco: Never Used  Substance Use Topics  . Alcohol use: Never  . Drug use: Never    Home Medications Prior to Admission medications   Medication Sig Start Date End Date Taking? Authorizing Provider  albuterol (VENTOLIN HFA) 108 (90 Base) MCG/ACT inhaler Inhale 2 puffs  into the lungs every 6 (six) hours as needed for wheezing or shortness of breath.   Yes [provider]  diphenhydramine-acetaminophen (TYLENOL PM) 25-500 MG TABS tablet Take 1 tablet by mouth at bedtime as needed.   Yes [provider]  furosemide (LASIX) 40 MG tablet Take 1 tablet (40 mg total) by mouth daily. 10/03/19 10/02/20 Yes Vassie Loll, MD  nadolol (CORGARD) 40 MG tablet Take 1 tablet (40 mg total) by mouth daily. Patient not taking: Reported on 09/14/2020 11/07/19   Anice Paganini, NP  pantoprazole (PROTONIX) 20 MG tablet Take 1 tablet (20 mg total) by mouth daily. Patient not taking: Reported on 09/14/2020 10/03/19 01/01/20  Vassie Loll, MD  spironolactone (ALDACTONE) 100 MG tablet Take 1 tablet (100 mg total) by mouth daily. Patient not taking: Reported on 09/14/2020 10/04/19   Vassie Loll, MD    Allergies    Patient has no known allergies.  Review of Systems   Review of Systems  Gastrointestinal: Positive for abdominal pain.  All other systems reviewed and are negative.   Physical Exam Updated Vital Signs BP 114/73 (BP Location: Right Arm)   Pulse 91   Temp 99 F (37.2 C) (Oral)   Resp 20   Ht 5\' 7"  (1.702 m)  Wt (!) 175.5 kg   SpO2 97%   BMI 60.61 kg/m   Physical Exam Vitals and nursing note reviewed.   Morbidly obese 64 year old female, resting comfortably and in no acute distress. Vital signs are normal. Oxygen saturation is 97%, which is normal. Head is normocephalic and atraumatic. PERRLA, EOMI. Oropharynx is clear. Neck is nontender and supple without adenopathy or JVD. Back is nontender and there is no CVA tenderness. Lungs are clear without rales, wheezes, or rhonchi. Chest is nontender. Heart has regular rate and rhythm without murmur. Abdomen is soft, nontender without masses or hepatosplenomegaly and peristalsis is normoactive. Anasarca is present with pitting edema over the abdominal wall. Extremities have 4+ edema. A bulla  is present on the lateral aspect of the right lower leg. Scarring is present adjacent to the bulla from prior similar episode. Skin is warm and dry without rash. Neurologic: Mental status is normal, cranial nerves are intact, there are no motor or sensory deficits.   ED Results / Procedures / Treatments   Labs (all labs ordered are listed, but only abnormal results are displayed) Labs Reviewed  CBC - Abnormal; Notable for the following components:      Result Value   WBC 3.3 (*)    RBC 3.78 (*)    MCV 104.5 (*)    MCH 34.7 (*)    RDW 16.1 (*)    Platelets 92 (*)    All other components within normal limits  BASIC METABOLIC PANEL - Abnormal; Notable for the following components:   Calcium 8.2 (*)    All other components within normal limits  RESPIRATORY PANEL BY RT PCR (FLU A&B, COVID)  LIPASE, BLOOD  BRAIN NATRIURETIC PEPTIDE  HEPATIC FUNCTION PANEL  PROTIME-INR  URINALYSIS, ROUTINE W REFLEX MICROSCOPIC  CBC  CREATININE, SERUM  COMPREHENSIVE METABOLIC PANEL  PROTIME-INR  APTT  MAGNESIUM  PHOSPHORUS   Procedures Procedures  Medications Ordered in ED Medications  spironolactone (ALDACTONE) tablet 100 mg (has no administration in time range)  enoxaparin (LOVENOX) injection 40 mg (has no administration in time range)  furosemide (LASIX) injection 40 mg (40 mg Intravenous Given 09/15/20 0008)    ED Course  I have reviewed the triage vital signs and the nursing notes.  Pertinent lab results that were available during my care of the patient were reviewed by me and considered in my medical decision making (see chart for details).  MDM Rules/Calculators/A&P Anasarca secondary to NASH with cirrhosis, medication noncompliance. Labs show leukopenia, thrombocytopenia, macrocytosis consistent with known history of cirrhosis. Hepatic function tests have been ordered. Old records are reviewed, and she did have a prior admission for anasarca.  Given her degree of swelling and  development of a bulla on the right leg, I feel she will need to be admitted for reinitiation of her diuretic.  She is given a dose of spironolactone and furosemide.  Case is discussed with Dr. Thomes Dinning of [facility], who agrees to admit the patient.  Final Clinical Impression(s) / ED Diagnoses Final diagnoses:  Anasarca  NASH (nonalcoholic steatohepatitis)  Other neutropenia (HCC)  Thrombocytopenia (HCC)  Macrocytosis without anemia    Rx / DC Orders ED Discharge Orders    None       Dione Booze, MD 09/15/20 0045

## 2020-09-14 NOTE — ED Notes (Signed)
Autumn Brosnahan can be contacted for patient updates. [phone]

## 2020-09-15 ENCOUNTER — Inpatient Hospital Stay (HOSPITAL_COMMUNITY): Payer: Medicaid Other

## 2020-09-15 DIAGNOSIS — K7460 Unspecified cirrhosis of liver: Secondary | ICD-10-CM | POA: Diagnosis present

## 2020-09-15 DIAGNOSIS — E8770 Fluid overload, unspecified: Secondary | ICD-10-CM | POA: Diagnosis present

## 2020-09-15 DIAGNOSIS — Z8249 Family history of ischemic heart disease and other diseases of the circulatory system: Secondary | ICD-10-CM | POA: Diagnosis not present

## 2020-09-15 DIAGNOSIS — Z720 Tobacco use: Secondary | ICD-10-CM

## 2020-09-15 DIAGNOSIS — K7581 Nonalcoholic steatohepatitis (NASH): Secondary | ICD-10-CM | POA: Diagnosis present

## 2020-09-15 DIAGNOSIS — D708 Other neutropenia: Secondary | ICD-10-CM

## 2020-09-15 DIAGNOSIS — D696 Thrombocytopenia, unspecified: Secondary | ICD-10-CM

## 2020-09-15 DIAGNOSIS — J449 Chronic obstructive pulmonary disease, unspecified: Secondary | ICD-10-CM | POA: Diagnosis present

## 2020-09-15 DIAGNOSIS — R188 Other ascites: Secondary | ICD-10-CM

## 2020-09-15 DIAGNOSIS — R601 Generalized edema: Secondary | ICD-10-CM

## 2020-09-15 DIAGNOSIS — D7589 Other specified diseases of blood and blood-forming organs: Secondary | ICD-10-CM | POA: Diagnosis not present

## 2020-09-15 DIAGNOSIS — Z833 Family history of diabetes mellitus: Secondary | ICD-10-CM | POA: Diagnosis not present

## 2020-09-15 DIAGNOSIS — I8510 Secondary esophageal varices without bleeding: Secondary | ICD-10-CM | POA: Diagnosis present

## 2020-09-15 DIAGNOSIS — S81801A Unspecified open wound, right lower leg, initial encounter: Secondary | ICD-10-CM

## 2020-09-15 DIAGNOSIS — Z6841 Body Mass Index (BMI) 40.0 and over, adult: Secondary | ICD-10-CM | POA: Diagnosis not present

## 2020-09-15 DIAGNOSIS — I8500 Esophageal varices without bleeding: Secondary | ICD-10-CM | POA: Diagnosis not present

## 2020-09-15 DIAGNOSIS — F17210 Nicotine dependence, cigarettes, uncomplicated: Secondary | ICD-10-CM | POA: Diagnosis present

## 2020-09-15 DIAGNOSIS — S80821A Blister (nonthermal), right lower leg, initial encounter: Secondary | ICD-10-CM | POA: Diagnosis present

## 2020-09-15 DIAGNOSIS — M170 Bilateral primary osteoarthritis of knee: Secondary | ICD-10-CM | POA: Diagnosis present

## 2020-09-15 DIAGNOSIS — Z20822 Contact with and (suspected) exposure to covid-19: Secondary | ICD-10-CM | POA: Diagnosis present

## 2020-09-15 LAB — URINALYSIS, ROUTINE W REFLEX MICROSCOPIC
Bacteria, UA: NONE SEEN
Bilirubin Urine: NEGATIVE
Glucose, UA: NEGATIVE mg/dL
Ketones, ur: NEGATIVE mg/dL
Nitrite: NEGATIVE
Protein, ur: NEGATIVE mg/dL
Specific Gravity, Urine: 1.018 (ref 1.005–1.030)
pH: 5 (ref 5.0–8.0)

## 2020-09-15 LAB — RESPIRATORY PANEL BY RT PCR (FLU A&B, COVID)
Influenza A by PCR: NEGATIVE
Influenza B by PCR: NEGATIVE
SARS Coronavirus 2 by RT PCR: NEGATIVE

## 2020-09-15 LAB — COMPREHENSIVE METABOLIC PANEL
ALT: 20 U/L (ref 0–44)
AST: 35 U/L (ref 15–41)
Albumin: 2 g/dL — ABNORMAL LOW (ref 3.5–5.0)
Alkaline Phosphatase: 53 U/L (ref 38–126)
Anion gap: 9 (ref 5–15)
BUN: 12 mg/dL (ref 8–23)
CO2: 25 mmol/L (ref 22–32)
Calcium: 8 mg/dL — ABNORMAL LOW (ref 8.9–10.3)
Chloride: 106 mmol/L (ref 98–111)
Creatinine, Ser: 0.73 mg/dL (ref 0.44–1.00)
GFR, Estimated: >60 mL/min (ref >60)
Glucose, Bld: 84 mg/dL (ref 70–99)
Potassium: 3.5 mmol/L (ref 3.5–5.1)
Sodium: 140 mmol/L (ref 135–145)
Total Bilirubin: 1.5 mg/dL — ABNORMAL HIGH (ref 0.3–1.2)
Total Protein: 5.7 g/dL — ABNORMAL LOW (ref 6.5–8.1)

## 2020-09-15 LAB — MAGNESIUM: Magnesium: 2 mg/dL (ref 1.7–2.4)

## 2020-09-15 LAB — PROTIME-INR
INR: 1.2 (ref 0.8–1.2)
Prothrombin Time: 14.8 seconds (ref 11.4–15.2)

## 2020-09-15 LAB — PHOSPHORUS: Phosphorus: 3.1 mg/dL (ref 2.5–4.6)

## 2020-09-15 LAB — APTT: aPTT: 32 seconds (ref 24–36)

## 2020-09-15 MED ORDER — ENOXAPARIN SODIUM 40 MG/0.4ML ~~LOC~~ SOLN
40.0000 mg | SUBCUTANEOUS | Status: DC
  Administered 2020-09-15: 40 mg via SUBCUTANEOUS
  Filled 2020-09-15: qty 0.4

## 2020-09-15 MED ORDER — FUROSEMIDE 10 MG/ML IJ SOLN
40.0000 mg | Freq: Two times a day (BID) | INTRAMUSCULAR | Status: DC
  Administered 2020-09-15 – 2020-09-18 (×7): 40 mg via INTRAVENOUS
  Filled 2020-09-15 (×8): qty 4

## 2020-09-15 MED ORDER — NICOTINE 14 MG/24HR TD PT24
14.0000 mg | MEDICATED_PATCH | Freq: Every day | TRANSDERMAL | Status: DC
  Filled 2020-09-15 (×3): qty 1

## 2020-09-15 MED ORDER — NICOTINE 21 MG/24HR TD PT24
21.0000 mg | MEDICATED_PATCH | Freq: Every day | TRANSDERMAL | Status: DC
  Filled 2020-09-15: qty 1

## 2020-09-15 NOTE — H&P (Signed)
History and Physical  Alexis Blankenship DXI:[id number] DOB: Aug 30, 1956 DOA: 09/14/2020  Referring physician: Dione Booze, Blankenship PCP: Alexis Blankenship  Patient coming from: Home   Chief Complaint: Abdominal distention  HPI: Alexis Blankenship is a 64 y.o. female with medical history significant for osteoarthritis of Alexis knees, COPD, morbid obesity, liver cirrhosis due to NASH who presents to [facility] emergency department due to abdominal distention and discomfort.  Patient complaining of 84-month history of slowly progressive abdominal swelling as well as blister on her right leg.  She states that she ran out of her diuretic and that she has not taken any diuretic in 3 months.  Patient lamented not calling PCP to renew her diuretic.  She denies chest pain, fever, chills, nausea or vomiting.  ED Course:  In [facility] emergency department, she was hemodynamically stable.  Work-up in [facility] ED showed leukopenia, thrombocytopenia.  Urinalysis was unimpressive for UTI.  BNP 78.  Respiratory panel for influenza AMB and SARS coronavirus 2 was negative.  IV Lasix 40 Mg x1 and Aldactone 100 mg x 1 was given.  Hospitalist was asked to admit patient for further evaluation and management.  Review of Systems: Constitutional: Negative for chills and fever.  HENT: Negative for ear pain and sore throat.   Eyes: Negative for pain and visual disturbance.  Respiratory: Negative for cough, chest tightness and shortness of breath.   Cardiovascular: Negative for chest pain and palpitations.  Gastrointestinal: Positive for abdominal distention and discomfort.   Endocrine: Negative for polyphagia and polyuria.  Genitourinary: Negative for decreased urine volume, dysuria Musculoskeletal: Positive for blister on right leg.  Negative for back pain.  Skin: Negative for color change and rash.  Allergic/Immunologic: Negative for immunocompromised state.  Neurological: Negative for tremors, syncope, speech difficulty, weakness,  light-headedness and headaches.  Hematological: Does not bruise/bleed easily.  All other systems reviewed and are negative    Past Medical History:  Diagnosis Date  . Arthritis    in knees  . COPD (chronic obstructive pulmonary disease) (HCC)   . 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 Teena Blankenship  . Morbid obesity (HCC)    Past Surgical History:  Procedure Laterality Date  . CESAREAN SECTION    . ESOPHAGOGASTRODUODENOSCOPY     Dr. Teena Blankenship    Social History:  reports that she has been smoking cigarettes. She has been smoking about 0.25 packs per day. She has never used smokeless tobacco. She reports that she does not drink alcohol and does not use drugs.   No Known Allergies  Family History  Problem Relation Age of Onset  . Diabetes Mother   . Diabetes Father   . Heart disease Father   . Diabetes Sister   . Diabetes Brother   . Liver cancer Neg Hx   . Liver disease Neg Hx   . Colon cancer Neg Hx    Prior to Admission medications   Medication Sig Start Date End Date Taking? Authorizing Provider  albuterol (VENTOLIN HFA) 108 (90 Base) MCG/ACT inhaler Inhale 2 puffs into [facility] lungs every 6 (six) hours as needed for wheezing or shortness of breath.   Yes Provider, Historical, Blankenship  diphenhydramine-acetaminophen (TYLENOL PM) 25-500 MG TABS tablet Take 1 tablet by mouth at bedtime as needed.   Yes Provider, Historical, Blankenship  furosemide (LASIX) 40 MG tablet Take 1 tablet (40 mg total) by mouth daily. 10/03/19 10/02/20 Yes Alexis Blankenship  nadolol (CORGARD) 40  MG tablet Take 1 tablet (40 mg total) by mouth daily. Patient not taking: Reported on 09/14/2020 11/07/19   Alexis Paganini, NP  pantoprazole (PROTONIX) 20 MG tablet Take 1 tablet (20 mg total) by mouth daily. Patient not taking: Reported on 09/14/2020 10/03/19 01/01/20  Alexis Blankenship  spironolactone (ALDACTONE) 100 MG tablet Take 1 tablet (100 mg total) by mouth  daily. Patient not taking: Reported on 09/14/2020 10/04/19   Alexis Blankenship    Physical Exam: BP 98/62 (BP Location: Right Arm)   Pulse 82   Temp 99 F (37.2 C) (Oral)   Resp 10   Ht 5\' 7"  (1.702 m)   Wt (!) 175.5 kg   SpO2 97%   BMI 60.61 kg/m   . General: 64 y.o. year-old female well developed well nourished in no acute distress.  Alert and oriented x3. 77 HEENT: Normocephalic, atraumatic . Neck: Supple, trachea media . Cardiovascular: Regular rate and rhythm with no rubs or gallops.  No thyromegaly or JVD noted.  No lower extremity edema. 2/4 pulses in all 4 extremities. Marland Kitchen Respiratory: Clear to auscultation with no wheezes or rales. Good inspiratory effort. . Abdomen: Soft,, nontender, distended with normal bowel sounds x4 quadrants. . Muskuloskeletal: Old scar wound with bulla on lateral aspect of RLE.  Marland Kitchen Neuro: CN II-XII intact, strength, sensation, reflexes . Skin: No ulcerative lesions noted or rashes . Psychiatry: Judgement and insight appear normal. Mood is appropriate for condition and setting          Labs on Admission:  Basic Metabolic Panel: Recent Labs  Lab 09/14/20 2150  NA 139  K 3.6  CL 107  CO2 27  GLUCOSE 96  BUN 12  CREATININE 0.87  CALCIUM 8.2*   Liver Function Tests: No results for input(s): AST, ALT, ALKPHOS, BILITOT, PROT, ALBUMIN in [facility] last 168 hours. Recent Labs  Lab 09/14/20 2150  LIPASE 28   No results for input(s): AMMONIA in [facility] last 168 hours. CBC: Recent Labs  Lab 09/14/20 2150  WBC 3.3*  HGB 13.1  HCT 39.5  MCV 104.5*  PLT 92*   Cardiac Enzymes: No results for input(s): CKTOTAL, CKMB, CKMBINDEX, TROPONINI in [facility] last 168 hours.  BNP (last 3 results) Recent Labs    09/14/20 2150  BNP 78.0    ProBNP (last 3 results) No results for input(s): PROBNP in [facility] last 8760 hours.  CBG: No results for input(s): GLUCAP in [facility] last 168 hours.  Radiological Exams on Admission: No results found.  EKG: I independently  viewed [facility] EKG done and my findings are as followed: No EKG was done in [facility] ED  Assessment/Plan Present on Admission: . Anasarca . Liver cirrhosis secondary to NASH (HCC) . Morbid obesity (HCC) . COPD (chronic obstructive pulmonary disease) (HCC)  Principal Problem:   Anasarca Active Problems:   Liver cirrhosis secondary to NASH (HCC)   Morbid obesity (HCC)   COPD (chronic obstructive pulmonary disease) (HCC)   Leg wound, right  Anasarca secondary to NASH liver cirrhosis Continue IV Lasix 40 mg twice daily Continue fluid and sodium restriction Patient will be placed n.p.o. at this time and has been for possible paracentesis in [facility] morning IR will be consulted for possible therapeutic paracentesis  Right leg wound with blister Continue wound care  Thrombocytopenia (chronic)-stable  Morbid obesity (BMI 60.61) Patient will be counseled on lifestyle modification when more stable  COPD/Tobacco abuse Smoking cessation recommended Continue nicotine patch   DVT prophylaxis: SCD (no chemoprophylaxis at  this time in anticipation for possible paracentesis in [facility] morning)  Code Status: Full code  Family Communication: None at bedside  Disposition Plan:  Patient is from:                        home Anticipated DC to:                   home Anticipated DC date:               1 day Anticipated DC barriers:          Patient unstable to be discharged at this time due to anasarca that might require (in [facility] morning.   Consults called: IR  Admission status: Observation    Frankey Shown Blankenship [facility] Pager [phone]  If 7PM-7AM, please contact night-coverage [email or web address] Password [facility]  09/15/2020, 7:29 AM

## 2020-09-15 NOTE — ED Notes (Addendum)
Entered room and introduced self to patient. Pt is resting in bed with no obvious signs of distress noted. Bed is locked in the lowest position, side rails x2, call bell within reach. All questions and concerns voiced addressed by this RN at this time. Educated on plan of care, call light use and hourly rounding. In agreement at this time.

## 2020-09-15 NOTE — Progress Notes (Signed)
PROGRESS NOTE  Alexis Blankenship VPX:[id number] DOB: 05-Dec-1956 DOA: 09/14/2020 PCP: [facility]  Brief History:  64 y/o female with NASH liver cirrhosis COPD, morbid obesity presenting with 5-month history of progressive abdominal distention and discomfort.  The patient has also been complaining of increasing leg edema with blister formation.  She states that she had run out of her prescription medications, particularly her diuretics about 3 months prior to this admission.  In addition, the patient has not seen her primary care provider for at least the same period of time.  She endorses some indiscretion with fluid and salt intake.  She denies any fevers, chills, chest pain, nausea, vomiting, diarrhea, coughing, hemoptysis. In the emergency department, the patient had a low-grade temperature 9 9.1 F.  She was hemodynamically stable albeit with soft blood pressures.  Systolic blood pressures were in the upper 90s to low 100s.  The patient was started on intravenous furosemide and oral spironolactone.  BMP was essentially unremarkable with serum creatinine 0.87.  WBC 3.3, hemoglobin 13.1, platelets 92,000.  BNP was 78.0.  Assessment/Plan: Anasarca/decompensated liver cirrhosis -Start IV furosemide -Restart spironolactone as blood pressure allows -Urine protein creatinine ratio -Low-sodium diet -INR 1.2 -Request paracentesis -holding nadolol due to soft BPs  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 -Monitor for signs of bleeding  Morbid obesity -BMI 60.61 -Lifestyle modification  Esophageal varices -No signs of bleeding presently  COPD/tobacco abuse -Tobacco cessation discussed -stable on RA -nicoderm patch  Right leg wound -not infected -wound care consult    Status is: Inpatient  Remains inpatient appropriate because:IV treatments appropriate due to intensity of illness or inability to take PO   Dispo: The patient is from: Home               Anticipated d/c is to: Home              Anticipated d/c date is: 3 days              Patient currently is not medically stable to d/c.        Family Communication:  no Family at bedside  Consultants:  none  Code Status:  FULL  DVT Prophylaxis:  SCDs   Procedures: As Listed in Progress Note Above  Antibiotics: None   Total time spent 35 minutes.  Greater than 50% spent face to face counseling and coordinating care.    Subjective:  Patient denies fevers, chills, headache, chest pain, dyspnea, nausea, vomiting, diarrhea, abdominal pain, dysuria, hematuria, hematochezia, and melena. Abdomen still feel "TIGHT" Objective: Vitals:   09/15/20 0130 09/15/20 0300 09/15/20 0430 09/15/20 0630  BP: 102/72 97/68 100/63 98/62  Pulse: 85 84 76 82  Resp: 17 (!) 21 13 10   Temp:      TempSrc:      SpO2: 97% 97% 95% 97%  Weight:      Height:       No intake or output data in the 24 hours ending 09/15/20 1401 Weight change:  Exam:   General:  Pt is alert, follows commands appropriately, not in acute distress  HEENT: No icterus, No thrush, No neck mass, [place]/AT  Cardiovascular: RRR, S1/S2, no rubs, no gallops  Respiratory: CTA bilaterally, no wheezing, no crackles, no rhonchi  Abdomen: Soft/+BS, non tender, mildly distended, no guarding  Extremities: 2 + LE edema, No lymphangitis, No petechiae, No rashes, no synovitis   Data Reviewed: I have personally reviewed following  labs and imaging studies Basic Metabolic Panel: Recent Labs  Lab 09/14/20 2150 09/15/20 0904  NA 139 140  K 3.6 3.5  CL 107 106  CO2 27 25  GLUCOSE 96 84  BUN 12 12  CREATININE 0.87 0.73  CALCIUM 8.2* 8.0*  MG  --  2.0  PHOS  --  3.1   Liver Function Tests: Recent Labs  Lab 09/15/20 0904  AST 35  ALT 20  ALKPHOS 53  BILITOT 1.5*  PROT 5.7*  ALBUMIN 2.0*   Recent Labs  Lab 09/14/20 2150  LIPASE 28   No results for input(s): AMMONIA in the last 168 hours. Coagulation  Profile: Recent Labs  Lab 09/15/20 0904  INR 1.2   CBC: Recent Labs  Lab 09/14/20 2150  WBC 3.3*  HGB 13.1  HCT 39.5  MCV 104.5*  PLT 92*   Cardiac Enzymes: No results for input(s): CKTOTAL, CKMB, CKMBINDEX, TROPONINI in the last 168 hours. BNP: Invalid input(s): POCBNP CBG: No results for input(s): GLUCAP in the last 168 hours. HbA1C: No results for input(s): HGBA1C in the last 72 hours. Urine analysis:    Component Value Date/Time   COLORURINE AMBER (A) 09/15/2020 0040   APPEARANCEUR HAZY (A) 09/15/2020 0040   LABSPEC 1.018 09/15/2020 0040   PHURINE 5.0 09/15/2020 0040   GLUCOSEU NEGATIVE 09/15/2020 0040   HGBUR SMALL (A) 09/15/2020 0040   BILIRUBINUR NEGATIVE 09/15/2020 0040   KETONESUR NEGATIVE 09/15/2020 0040   PROTEINUR NEGATIVE 09/15/2020 0040   NITRITE NEGATIVE 09/15/2020 0040   LEUKOCYTESUR TRACE (A) 09/15/2020 0040   Sepsis Labs: @LABRCNTIP (procalcitonin:4,lacticidven:4) ) Recent Results (from the past 240 hour(s))  Respiratory Panel by RT PCR (Flu A&B, Covid) - Nasopharyngeal Swab     Status: None   Collection Time: 09/15/20 12:10 AM   Specimen: Nasopharyngeal Swab  Result Value Ref Range Status   SARS Coronavirus 2 by RT PCR NEGATIVE NEGATIVE Final    Comment: (NOTE) SARS-CoV-2 target nucleic acids are NOT DETECTED.  The SARS-CoV-2 RNA is generally detectable in upper respiratoy specimens during the acute phase of infection. The lowest concentration of SARS-CoV-2 viral copies this assay can detect is 131 copies/mL. A negative result does not preclude SARS-Cov-2 infection and should not be used as the sole basis for treatment or other patient management decisions. A negative result may occur with  improper specimen collection/handling, submission of specimen other than nasopharyngeal swab, presence of viral mutation(s) within the areas targeted by this assay, and inadequate number of viral copies (<131 copies/mL). A negative result must be  combined with clinical observations, patient history, and epidemiological information. The expected result is Negative.  Fact Sheet for Patients:  11/15/20  Fact Sheet for Healthcare Providers:  https://www.moore.com/  This test is no t yet approved or cleared by the https://www.young.biz/ FDA and  has been authorized for detection and/or diagnosis of SARS-CoV-2 by FDA under an Emergency Use Authorization (EUA). This EUA will remain  in effect (meaning this test can be used) for the duration of the COVID-19 declaration under Section 564(b)(1) of the Act, 21 U.S.C. section 360bbb-3(b)(1), unless the authorization is terminated or revoked sooner.     Influenza A by PCR NEGATIVE NEGATIVE Final   Influenza B by PCR NEGATIVE NEGATIVE Final    Comment: (NOTE) The Xpert Xpress SARS-CoV-2/FLU/RSV assay is intended as an aid in  the diagnosis of influenza from Nasopharyngeal swab specimens and  should not be used as a sole basis for treatment. Nasal washings and  aspirates  are unacceptable for Xpert Xpress SARS-CoV-2/FLU/RSV  testing.  Fact Sheet for Patients: https://www.moore.com/  Fact Sheet for Healthcare Providers: https://www.young.biz/  This test is not yet approved or cleared by the [place] FDA and  has been authorized for detection and/or diagnosis of SARS-CoV-2 by  FDA under an Emergency Use Authorization (EUA). This EUA will remain  in effect (meaning this test can be used) for the duration of the  Covid-19 declaration under Section 564(b)(1) of the Act, 21  U.S.C. section 360bbb-3(b)(1), unless the authorization is  terminated or revoked. Performed at [facility], [address], [place], [place] [address]      Scheduled Meds: . furosemide  40 mg Intravenous Q12H  . nicotine  21 mg Transdermal Daily  . spironolactone  100 mg Oral Once   Continuous  Infusions:  Procedures/Studies: No results found.  Catarina Hartshorn, DO  [facility]  If 7PM-7AM, please contact night-coverage [email or web address] Password [email or web address] 09/15/2020, 2:01 PM   LOS: 0 days

## 2020-09-16 DIAGNOSIS — I8500 Esophageal varices without bleeding: Secondary | ICD-10-CM | POA: Diagnosis not present

## 2020-09-16 DIAGNOSIS — K7460 Unspecified cirrhosis of liver: Secondary | ICD-10-CM | POA: Diagnosis not present

## 2020-09-16 DIAGNOSIS — R601 Generalized edema: Secondary | ICD-10-CM | POA: Diagnosis not present

## 2020-09-16 LAB — CBC
HCT: 38.8 % (ref 36.0–46.0)
Hemoglobin: 12.4 g/dL (ref 12.0–15.0)
MCH: 33.3 pg (ref 26.0–34.0)
MCHC: 32 g/dL (ref 30.0–36.0)
MCV: 104.3 fL — ABNORMAL HIGH (ref 80.0–100.0)
Platelets: 98 10*3/uL — ABNORMAL LOW (ref 150–400)
RBC: 3.72 MIL/uL — ABNORMAL LOW (ref 3.87–5.11)
RDW: 15.9 % — ABNORMAL HIGH (ref 11.5–15.5)
WBC: 3.6 10*3/uL — ABNORMAL LOW (ref 4.0–10.5)
nRBC: 0 % (ref 0.0–0.2)

## 2020-09-16 LAB — COMPREHENSIVE METABOLIC PANEL
ALT: 19 U/L (ref 0–44)
AST: 36 U/L (ref 15–41)
Albumin: 2.1 g/dL — ABNORMAL LOW (ref 3.5–5.0)
Alkaline Phosphatase: 51 U/L (ref 38–126)
Anion gap: 10 (ref 5–15)
BUN: 14 mg/dL (ref 8–23)
CO2: 26 mmol/L (ref 22–32)
Calcium: 8.1 mg/dL — ABNORMAL LOW (ref 8.9–10.3)
Chloride: 105 mmol/L (ref 98–111)
Creatinine, Ser: 0.86 mg/dL (ref 0.44–1.00)
GFR, Estimated: >60 mL/min (ref >60)
Glucose, Bld: 84 mg/dL (ref 70–99)
Potassium: 3.5 mmol/L (ref 3.5–5.1)
Sodium: 141 mmol/L (ref 135–145)
Total Bilirubin: 1.9 mg/dL — ABNORMAL HIGH (ref 0.3–1.2)
Total Protein: 5.6 g/dL — ABNORMAL LOW (ref 6.5–8.1)

## 2020-09-16 MED ORDER — CALCIUM CARBONATE ANTACID 500 MG PO CHEW
1.0000 | CHEWABLE_TABLET | Freq: Two times a day (BID) | ORAL | Status: DC
  Administered 2020-09-16 – 2020-09-18 (×5): 200 mg via ORAL
  Filled 2020-09-16 (×5): qty 1

## 2020-09-16 MED ORDER — ACETAMINOPHEN 325 MG PO TABS
650.0000 mg | ORAL_TABLET | Freq: Four times a day (QID) | ORAL | Status: DC | PRN
  Administered 2020-09-16 – 2020-09-17 (×2): 650 mg via ORAL
  Filled 2020-09-16 (×2): qty 2

## 2020-09-16 MED ORDER — SPIRONOLACTONE 25 MG PO TABS
25.0000 mg | ORAL_TABLET | Freq: Every day | ORAL | Status: DC
  Administered 2020-09-16: 25 mg via ORAL
  Filled 2020-09-16 (×2): qty 1

## 2020-09-16 NOTE — TOC Initial Note (Signed)
Transition of Care [facility]) - Initial/Assessment Note    Patient Details  Name: Alexis Blankenship MRN: [id number] Date of Birth: 09-Dec-1955  Transition of Care [facility]) CM/SW Contact:    Leitha Bleak, RN Phone Number: 09/16/2020, 11:44 AM  Clinical Narrative:     Patient admitted with Anasarca. New diagnosis for patient. Patient lost her son last year, now her 70 year granddaughter lives with her. She uses a walker at home and since COVID has not been going out. Orders groceries online and uses face time for PCP visits.  She does not have a bathroom scale. TOC checked with do not have an to give patient. She will get one orders.  We also discussed diet and fluid restriction. This is all new to her, she made a list of questions to ask PCP for further eduction on her diagnosis.  Patient state she will go home at discharge.               Expected Discharge Plan: Home/Self Care Barriers to Discharge: Continued Medical Work up   Patient Goals and CMS Choice Patient states their goals for this hospitalization and ongoing recovery are:: to go home. CMS Medicare.gov Compare Post Acute Care list provided to:: Patient Choice offered to / list presented to : Patient  Expected Discharge Plan and Services Expected Discharge Plan: Home/Self Care      Living arrangements for the past 2 months: Single Family Home                     Prior Living Arrangements/Services Living arrangements for the past 2 months: Single Family Home Lives with:: Adult Children (Grand daugher)          Need for Family Participation in Patient Care: Yes (Comment) Care giver support system in place?: Yes (comment)   Criminal Activity/Legal Involvement Pertinent to Current Situation/Hospitalization: No - Comment as needed  Activities of Daily Living Home Assistive Devices/Equipment: Walker (specify type) ADL Screening (condition at time of admission) Patient's cognitive ability adequate to safely complete daily  activities?: Yes Is the patient deaf or have difficulty hearing?: No Does the patient have difficulty seeing, even when wearing glasses/contacts?: No Does the patient have difficulty concentrating, remembering, or making decisions?: No Patient able to express need for assistance with ADLs?: Yes Does the patient have difficulty dressing or bathing?: No Independently performs ADLs?: Yes (appropriate for developmental age) Does the patient have difficulty walking or climbing stairs?: Yes Weakness of Legs: Both Weakness of Arms/Hands: None       Emotional Assessment     Affect (typically observed): Accepting, Pleasant   Alcohol / Substance Use: Not Applicable Psych Involvement: No (comment)  Admission diagnosis:  Anasarca [R60.1] Thrombocytopenia (HCC) [D69.6] NASH (nonalcoholic steatohepatitis) [K75.81] Other neutropenia (HCC) [D70.8] Macrocytosis without anemia [D75.89] Patient Active Problem List   Diagnosis Date Noted  . Leg wound, right 09/15/2020  . Tobacco abuse 09/15/2020  . Thrombocytopenia (HCC) 09/15/2020  . Other neutropenia (HCC)   . Esophageal varices (HCC) 11/07/2019  . Ascites   . Anasarca 09/26/2019  . Liver cirrhosis secondary to NASH (HCC) 09/26/2019  . Morbid obesity (HCC)   . COPD (chronic obstructive pulmonary disease) (HCC)   . Hypokalemia    PCP:  [facility] Pharmacy:   [facility] - [place], [place] - [address] [place] [address] Phone: [phone] Fax: [phone]    Readmission Risk Interventions Readmission Risk Prevention Plan 09/16/2020  Medication  Screening Complete  Transportation Screening Complete  Some recent data might be hidden

## 2020-09-16 NOTE — Progress Notes (Signed)
PROGRESS NOTE  Keron Koffman PYP:[id number] DOB: [birth year] DOA: 09/14/2020 PCP: [facility]  Brief History:  64 y/o female with NASH liver cirrhosis COPD, morbid obesity presenting with 66-month history of progressive abdominal distention and discomfort.  The patient has also been complaining of increasing leg edema with blister formation.  She states that she had run out of her prescription medications, particularly her diuretics about 3 months prior to this admission.  In addition, the patient has not seen her primary care provider for at least the same period of time.  She endorses some indiscretion with fluid and salt intake.  She denies any fevers, chills, chest pain, nausea, vomiting, diarrhea, coughing, hemoptysis. In the emergency department, the patient had a low-grade temperature 9 9.1 F.  She was hemodynamically stable albeit with soft blood pressures.  Systolic blood pressures were in the upper 90s to low 100s.  The patient was started on intravenous furosemide and oral spironolactone.  BMP was essentially unremarkable with serum creatinine 0.87.  WBC 3.3, hemoglobin 13.1, platelets 92,000.  BNP was 78.0.  Assessment/Plan: Anasarca/decompensated liver cirrhosis -Continue IV furosemide -Restart spironolactone low dose and titrate up as BP allows -Urine protein creatinine ratio -Low-sodium diet -INR 1.2 -Request paracentesis--minimal ascites, not enough for para -holding nadolol due to soft BPs -I/Os--incomplete  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 -Monitor for signs of bleeding  Morbid obesity -BMI 60.61 -Lifestyle modification  Esophageal varices -No signs of bleeding presently  COPD/tobacco abuse -Tobacco cessation discussed -stable on RA -nicoderm patch  Right leg wound -not infected -wound care consult    Status is: Inpatient  Remains inpatient appropriate because:IV treatments appropriate due to  intensity of illness or inability to take PO   Dispo: The patient is from: Home  Anticipated d/c is to: Home  Anticipated d/c date is: 3 days  Patient currently is not medically stable to d/c.        Family Communication:  no Family at bedside  Consultants:  none  Code Status:  FULL  DVT Prophylaxis:  SCDs   Procedures: As Listed in Progress Note Above  Antibiotics: None      Subjective: Patient is feeling better.  Denies f/c, cp, sob, n/v/d, abd pain.  She states her abd feels less swollen  Objective: Vitals:   09/15/20 1412 09/15/20 2208 09/16/20 0636 09/16/20 1334  BP: 134/82 (!) 108/57 111/69 113/67  Pulse: 88 93 88 84  Resp: 20  20 20   Temp: 98 F (36.7 C) 98.1 F (36.7 C) 98.2 F (36.8 C) 98 F (36.7 C)  TempSrc: Oral Oral Oral Oral  SpO2: 97% 98% 99% 95%  Weight: (!) 195.4 kg     Height: 5\' 7"  (1.702 m)       Intake/Output Summary (Last 24 hours) at 09/16/2020 1809 Last data filed at 09/16/2020 0400 Gross per 24 hour  Intake --  Output 900 ml  Net -900 ml   Weight change: 19.9 kg Exam:   General:  Pt is alert, follows commands appropriately, not in acute distress  HEENT: No icterus, No thrush, No neck mass, [place]/AT  Cardiovascular: RRR, S1/S2, no rubs, no gallops  Respiratory: bibasilar crackles. No wheeze  Abdomen: Soft/+BS, non tender, non distended, no guarding  Extremities: 3+LE edema, No lymphangitis, No petechiae, No rashes, no synovitis   Data Reviewed: I have personally reviewed following labs and imaging studies Basic Metabolic Panel: Recent Labs  Lab 09/14/20 2150  09/15/20 0904 09/16/20 0639  NA 139 140 141  K 3.6 3.5 3.5  CL 107 106 105  CO2 27 25 26   GLUCOSE 96 84 84  BUN 12 12 14   CREATININE 0.87 0.73 0.86  CALCIUM 8.2* 8.0* 8.1*  MG  --  2.0  --   PHOS  --  3.1  --    Liver Function Tests: Recent Labs  Lab 09/15/20 0904 09/16/20 0639  AST 35 36   ALT 20 19  ALKPHOS 53 51  BILITOT 1.5* 1.9*  PROT 5.7* 5.6*  ALBUMIN 2.0* 2.1*   Recent Labs  Lab 09/14/20 2150  LIPASE 28   No results for input(s): AMMONIA in the last 168 hours. Coagulation Profile: Recent Labs  Lab 09/15/20 0904  INR 1.2   CBC: Recent Labs  Lab 09/14/20 2150 09/16/20 0639  WBC 3.3* 3.6*  HGB 13.1 12.4  HCT 39.5 38.8  MCV 104.5* 104.3*  PLT 92* 98*   Cardiac Enzymes: No results for input(s): CKTOTAL, CKMB, CKMBINDEX, TROPONINI in the last 168 hours. BNP: Invalid input(s): POCBNP CBG: No results for input(s): GLUCAP in the last 168 hours. HbA1C: No results for input(s): HGBA1C in the last 72 hours. Urine analysis:    Component Value Date/Time   COLORURINE AMBER (A) 09/15/2020 0040   APPEARANCEUR HAZY (A) 09/15/2020 0040   LABSPEC 1.018 09/15/2020 0040   PHURINE 5.0 09/15/2020 0040   GLUCOSEU NEGATIVE 09/15/2020 0040   HGBUR SMALL (A) 09/15/2020 0040   BILIRUBINUR NEGATIVE 09/15/2020 0040   KETONESUR NEGATIVE 09/15/2020 0040   PROTEINUR NEGATIVE 09/15/2020 0040   NITRITE NEGATIVE 09/15/2020 0040   LEUKOCYTESUR TRACE (A) 09/15/2020 0040   Sepsis Labs: @LABRCNTIP (procalcitonin:4,lacticidven:4) ) Recent Results (from the past 240 hour(s))  Respiratory Panel by RT PCR (Flu A&B, Covid) - Nasopharyngeal Swab     Status: None   Collection Time: 09/15/20 12:10 AM   Specimen: Nasopharyngeal Swab  Result Value Ref Range Status   SARS Coronavirus 2 by RT PCR NEGATIVE NEGATIVE Final    Comment: (NOTE) SARS-CoV-2 target nucleic acids are NOT DETECTED.  The SARS-CoV-2 RNA is generally detectable in upper respiratoy specimens during the acute phase of infection. The lowest concentration of SARS-CoV-2 viral copies this assay can detect is 131 copies/mL. A negative result does not preclude SARS-Cov-2 infection and should not be used as the sole basis for treatment or other patient management decisions. A negative result may occur with  improper  specimen collection/handling, submission of specimen other than nasopharyngeal swab, presence of viral mutation(s) within the areas targeted by this assay, and inadequate number of viral copies (<131 copies/mL). A negative result must be combined with clinical observations, patient history, and epidemiological information. The expected result is Negative.  Fact Sheet for Patients:  11/15/2020  Fact Sheet for Healthcare Providers:   This test is no t yet approved or cleared by the 11/15/20 FDA and  has been authorized for detection and/or diagnosis of SARS-CoV-2 by FDA under an Emergency Use Authorization (EUA). This EUA will remain  in effect (meaning this test can be used) for the duration of the COVID-19 declaration under Section 564(b)(1) of the Act, 21 U.S.C. section 360bbb-3(b)(1), unless the authorization is terminated or revoked sooner.     Influenza A by PCR NEGATIVE NEGATIVE Final   Influenza B by PCR NEGATIVE NEGATIVE Final    Comment: (NOTE) The Xpert Xpress SARS-CoV-2/FLU/RSV assay is intended as an aid in  the diagnosis of influenza from Nasopharyngeal swab  specimens and  should not be used as a sole basis for treatment. Nasal washings and  aspirates are unacceptable for Xpert Xpress SARS-CoV-2/FLU/RSV  testing.  Fact Sheet for Patients: https://www.moore.com/  Fact Sheet for Healthcare Providers: https://www.young.biz/  This test is not yet approved or cleared by the [place] FDA and  has been authorized for detection and/or diagnosis of SARS-CoV-2 by  FDA under an Emergency Use Authorization (EUA). This EUA will remain  in effect (meaning this test can be used) for the duration of the  Covid-19 declaration under Section 564(b)(1) of the Act, 21  U.S.C. section 360bbb-3(b)(1), unless the authorization is  terminated or revoked. Performed at  [facility], [address], [place], [place] [address]      Scheduled Meds: . calcium carbonate  1 tablet Oral BID  . furosemide  40 mg Intravenous Q12H  . nicotine  14 mg Transdermal Daily   Continuous Infusions:  Procedures/Studies: [place] ASCITES (ABDOMEN LIMITED)  Result Date: 09/15/2020 CLINICAL DATA:  Evaluate for ascites. Abdominal distension. Cirrhosis. EXAM: LIMITED ABDOMEN ULTRASOUND FOR ASCITES TECHNIQUE: Limited ultrasound survey for ascites was performed in all four abdominal quadrants. COMPARISON:  09/26/2019 CT. FINDINGS: Trace right upper quadrant fluid. Minimal left upper quadrant fluid. No significant fluid in the lower quadrants. Not sufficient for paracentesis. IMPRESSION: Minimal abdominal ascites, insufficient for paracentesis. Electronically Signed   By: Jeronimo Greaves M.D.   On: 09/15/2020 15:39    Catarina Hartshorn, DO  [facility]  If 7PM-7AM, please contact night-coverage [email or web address] Password [email or web address] 09/16/2020, 6:09 PM   LOS: 1 day

## 2020-09-17 DIAGNOSIS — K7460 Unspecified cirrhosis of liver: Secondary | ICD-10-CM | POA: Diagnosis not present

## 2020-09-17 DIAGNOSIS — K7581 Nonalcoholic steatohepatitis (NASH): Secondary | ICD-10-CM | POA: Diagnosis not present

## 2020-09-17 DIAGNOSIS — R601 Generalized edema: Secondary | ICD-10-CM | POA: Diagnosis not present

## 2020-09-17 DIAGNOSIS — J449 Chronic obstructive pulmonary disease, unspecified: Secondary | ICD-10-CM | POA: Diagnosis not present

## 2020-09-17 LAB — COMPREHENSIVE METABOLIC PANEL
ALT: 19 U/L (ref 0–44)
AST: 37 U/L (ref 15–41)
Albumin: 2 g/dL — ABNORMAL LOW (ref 3.5–5.0)
Alkaline Phosphatase: 54 U/L (ref 38–126)
Anion gap: 9 (ref 5–15)
BUN: 15 mg/dL (ref 8–23)
CO2: 26 mmol/L (ref 22–32)
Calcium: 8.1 mg/dL — ABNORMAL LOW (ref 8.9–10.3)
Chloride: 105 mmol/L (ref 98–111)
Creatinine, Ser: 0.9 mg/dL (ref 0.44–1.00)
GFR, Estimated: >60 mL/min (ref >60)
Glucose, Bld: 80 mg/dL (ref 70–99)
Potassium: 3.2 mmol/L — ABNORMAL LOW (ref 3.5–5.1)
Sodium: 140 mmol/L (ref 135–145)
Total Bilirubin: 1.7 mg/dL — ABNORMAL HIGH (ref 0.3–1.2)
Total Protein: 5.5 g/dL — ABNORMAL LOW (ref 6.5–8.1)

## 2020-09-17 MED ORDER — POTASSIUM CHLORIDE CRYS ER 20 MEQ PO TBCR
40.0000 meq | EXTENDED_RELEASE_TABLET | Freq: Once | ORAL | Status: AC
  Administered 2020-09-17: 40 meq via ORAL
  Filled 2020-09-17: qty 2

## 2020-09-17 MED ORDER — ALBUMIN HUMAN 25 % IV SOLN
50.0000 g | Freq: Once | INTRAVENOUS | Status: AC
  Administered 2020-09-17: 50 g via INTRAVENOUS
  Filled 2020-09-17: qty 200

## 2020-09-17 MED ORDER — SPIRONOLACTONE 25 MG PO TABS
100.0000 mg | ORAL_TABLET | Freq: Every day | ORAL | Status: DC
  Administered 2020-09-17 – 2020-09-18 (×2): 100 mg via ORAL
  Filled 2020-09-17: qty 4

## 2020-09-17 NOTE — Progress Notes (Signed)
PROGRESS NOTE  Alexis Blankenship QPY:[id number] DOB: 1956-04-01 DOA: 09/14/2020 PCP: [facility]  Brief History:  64 y/o female with NASH liver cirrhosis COPD, morbid obesity presenting with 17-month history of progressive abdominal distention and discomfort.  The patient has also been complaining of increasing leg edema with blister formation.  She states that she had run out of her prescription medications, particularly her diuretics about 3 months prior to this admission.  In addition, the patient has not seen her primary care provider for at least the same period of time.  She endorses some indiscretion with fluid and salt intake.  She denies any fevers, chills, chest pain, nausea, vomiting, diarrhea, coughing, hemoptysis. In the emergency department, the patient had a low-grade temperature 9 9.1 F.  She was hemodynamically stable albeit with soft blood pressures.  Systolic blood pressures were in the upper 90s to low 100s.  The patient was started on intravenous furosemide and oral spironolactone.  BMP was essentially unremarkable with serum creatinine 0.87.  WBC 3.3, hemoglobin 13.1, platelets 92,000.  BNP was 78.0.  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        Family Communication:  no Family at bedside  Consultants:  none  Code Status:  FULL  DVT Prophylaxis:  SCDs   Procedures: As Listed in Progress Note Above  Antibiotics: None      Subjective: Continues to have significant edema.  Overall she says she is feeling a little better.  Objective: Vitals:   09/16/20 2055 09/17/20 0413 09/17/20 0757 09/17/20 1247  BP: 116/70 106/67 107/66 114/70  Pulse: 84 80 87 86  Resp: 16 20 18 16   Temp: 98 F (36.7 C) 98.5 F (36.9 C) 98 F (36.7 C) 98.3 F (36.8 C)  TempSrc: Oral  Oral Oral  SpO2: 96% 97% 100% 96%  Weight:      Height:        Intake/Output Summary (Last 24 hours) at 09/17/2020 1911 Last data filed at 09/17/2020 1857 Gross per 24 hour  Intake 200 ml  Output 200 ml  Net 0 ml   Weight change:  Exam:  General exam: Alert, awake, oriented x 3 Respiratory system: Clear to auscultation. Respiratory effort normal. Cardiovascular system:RRR. No murmurs, rubs, gallops. Gastrointestinal system: Abdomen is obese, soft and nontender. No organomegaly or masses felt. Normal bowel sounds heard. Central nervous system: Alert and oriented. No focal neurological deficits. Extremities: 2+ pedal edema bilaterally Skin: No rashes, lesions or ulcers  Psychiatry: Judgement and insight appear normal. Mood & affect appropriate.  Data Reviewed: I have personally reviewed following labs and imaging studies Basic Metabolic Panel: Recent Labs  Lab 09/14/20 2150 09/15/20 0904 09/16/20 0639 09/17/20 0554  NA 139 140 141 140  K 3.6 3.5 3.5 3.2*  CL 107 106 105  105  CO2 27 25 26 26   GLUCOSE 96 84 84 80  BUN 12 12 14 15   CREATININE 0.87 0.73 0.86 0.90  CALCIUM 8.2* 8.0* 8.1* 8.1*  MG  --  2.0  --   --   PHOS  --  3.1  --   --    Liver Function Tests: Recent Labs  Lab 09/15/20 0904 09/16/20 0639 09/17/20 0554  AST 35 36 37  ALT 20 19 19   ALKPHOS 53 51 54  BILITOT 1.5* 1.9* 1.7*  PROT 5.7* 5.6* 5.5*  ALBUMIN 2.0* 2.1* 2.0*   Recent Labs  Lab 09/14/20 2150  LIPASE 28   No results for input(s): AMMONIA in the last 168 hours. Coagulation Profile: Recent Labs  Lab 09/15/20 0904  INR 1.2   CBC: Recent Labs  Lab 09/14/20 2150 09/16/20 0639  WBC 3.3* 3.6*  HGB 13.1 12.4  HCT 39.5 38.8  MCV 104.5* 104.3*  PLT 92* 98*   Cardiac Enzymes: No results for input(s): CKTOTAL, CKMB, CKMBINDEX, TROPONINI in the last 168 hours. BNP: Invalid input(s): POCBNP CBG: No results for input(s): GLUCAP in the last 168 hours. HbA1C: No results for input(s): HGBA1C in the last 72 hours. Urine analysis:    Component Value Date/Time   COLORURINE AMBER (A) 09/15/2020 0040   APPEARANCEUR HAZY (A) 09/15/2020 0040   LABSPEC 1.018 09/15/2020 0040   PHURINE 5.0 09/15/2020 0040   GLUCOSEU NEGATIVE 09/15/2020 0040   HGBUR SMALL (A) 09/15/2020 0040   BILIRUBINUR NEGATIVE 09/15/2020 0040   KETONESUR NEGATIVE 09/15/2020 0040   PROTEINUR NEGATIVE 09/15/2020 0040   NITRITE NEGATIVE 09/15/2020 0040   LEUKOCYTESUR TRACE (A) 09/15/2020 0040   Sepsis Labs: @LABRCNTIP (procalcitonin:4,lacticidven:4) ) Recent Results (from the past 240 hour(s))  Respiratory Panel by RT PCR (Flu A&B, Covid) - Nasopharyngeal Swab     Status: None   Collection Time: 09/15/20 12:10 AM   Specimen: Nasopharyngeal Swab  Result Value Ref Range Status   SARS Coronavirus 2 by RT PCR NEGATIVE NEGATIVE Final    Comment: (NOTE) SARS-CoV-2 target nucleic acids are NOT DETECTED.  The SARS-CoV-2 RNA is generally detectable in upper respiratoy specimens during the acute phase of  infection. The lowest concentration of SARS-CoV-2 viral copies this assay can detect is 131 copies/mL. A negative result does not preclude SARS-Cov-2 infection and should not be used as the sole basis for treatment or other patient management decisions. A negative result may occur with  improper specimen collection/handling, submission of specimen other than nasopharyngeal swab, presence of viral mutation(s) within the areas targeted by this assay, and inadequate number of viral copies (<131 copies/mL). A negative result must be combined with clinical observations, patient history, and epidemiological information. The expected result is Negative.  Fact Sheet for Patients:  11/15/2020  Fact Sheet for Healthcare Providers:  11/15/2020  This test is no t yet approved or cleared by the FDA and  has been authorized for detection and/or diagnosis of SARS-CoV-2 by FDA under an Emergency Use Authorization (EUA). This EUA will remain  in effect (meaning this test can be used) for the duration of the COVID-19 declaration under Section 564(b)(1) of the Act, 21 U.S.C. section 360bbb-3(b)(1), unless the authorization is terminated or revoked sooner.  Influenza A by PCR NEGATIVE NEGATIVE Final   Influenza B by PCR NEGATIVE NEGATIVE Final    Comment: (NOTE) The Xpert Xpress SARS-CoV-2/FLU/RSV assay is intended as an aid in  the diagnosis of influenza from Nasopharyngeal swab specimens and  should not be used as a sole basis for treatment. Nasal washings and  aspirates are unacceptable for Xpert Xpress SARS-CoV-2/FLU/RSV  testing.  Fact Sheet for Patients: https://www.moore.com/  Fact Sheet for Healthcare Providers: https://www.young.biz/  This test is not yet approved or cleared by the [place] FDA and  has been authorized for detection and/or diagnosis of SARS-CoV-2  by  FDA under an Emergency Use Authorization (EUA). This EUA will remain  in effect (meaning this test can be used) for the duration of the  Covid-19 declaration under Section 564(b)(1) of the Act, 21  U.S.C. section 360bbb-3(b)(1), unless the authorization is  terminated or revoked. Performed at [facility], [address], [place], [place] [address]      Scheduled Meds: . calcium carbonate  1 tablet Oral BID  . furosemide  40 mg Intravenous Q12H  . nicotine  14 mg Transdermal Daily  . potassium chloride  40 mEq Oral Once  . spironolactone  100 mg Oral Daily   Continuous Infusions: . albumin human      Procedures/Studies: [place] ASCITES (ABDOMEN LIMITED)  Result Date: 09/15/2020 CLINICAL DATA:  Evaluate for ascites. Abdominal distension. Cirrhosis. EXAM: LIMITED ABDOMEN ULTRASOUND FOR ASCITES TECHNIQUE: Limited ultrasound survey for ascites was performed in all four abdominal quadrants. COMPARISON:  09/26/2019 CT. FINDINGS: Trace right upper quadrant fluid. Minimal left upper quadrant fluid. No significant fluid in the lower quadrants. Not sufficient for paracentesis. IMPRESSION: Minimal abdominal ascites, insufficient for paracentesis. Electronically Signed   By: Jeronimo Greaves M.D.   On: 09/15/2020 15:39    Erick Blinks, MD  [facility]  If 7PM-7AM, please contact night-coverage [email or web address]  09/17/2020, 7:11 PM   LOS: 2 days

## 2020-09-17 NOTE — Evaluation (Signed)
Physical Therapy Evaluation Patient Details Name: Zailah Zagami MRN: [id number] DOB: [birth year] Today's Date: 09/17/2020   History of Present Illness  Versia Vanasten is a 64 y.o. female with medical history significant for osteoarthritis of the knees, COPD, morbid obesity, liver cirrhosis due to NASH who presents to the emergency department due to abdominal distention and discomfort.  Patient complaining of 53-monthhistory of slowly progressive abdominal swelling as well as blister on her right leg.  She states that she ran out of her diuretic and that she has not taken any diuretic in 3 months.  Patient lamented not calling PCP to renew her diuretic.  She denies chest pain, fever, chills, nausea or vomiting.    Clinical Impression  Patient functioning near baseline for functional mobility and gait, demonstrates good return for ambulation in room and hallway without loss of balance.  Patient encouraged to ambulate as tolerated for length of stay.  Plan:  Patient discharged from physical therapy to care of nursing for ambulation daily as tolerated for length of stay.     Follow Up Recommendations Home health PT;Supervision - Intermittent    Equipment Recommendations  None recommended by PT    Recommendations for Other Services       Precautions / Restrictions Precautions Precautions: None Restrictions Weight Bearing Restrictions: No      Mobility  Bed Mobility Overal bed mobility: Modified Independent             General bed mobility comments: head of bed slightly raised, increased time  Transfers Overall transfer level: Modified independent Equipment used: Rolling walker (2 wheeled)                Ambulation/Gait Ambulation/Gait assistance: Supervision;Modified independent (Device/Increase time) Gait Distance (Feet): 40 Feet Assistive device: Rolling walker (2 wheeled) Gait Pattern/deviations: Decreased step length - right;Decreased stride length Gait velocity:  decreased   General Gait Details: demonstrates good return for ambulation in room and hallway without loss of balance  Stairs            Wheelchair Mobility    Modified Rankin (Stroke Patients Only)       Balance Overall balance assessment: Needs assistance Sitting-balance support: Feet supported;No upper extremity supported Sitting balance-Leahy Scale: Good Sitting balance - Comments: seated at EOB   Standing balance support: During functional activity;Bilateral upper extremity supported Standing balance-Leahy Scale: Fair Standing balance comment: fair/good using RW                             Pertinent Vitals/Pain Pain Assessment: No/denies pain    Home Living Family/patient expects to be discharged to:: Private residence Living Arrangements: Children Available Help at Discharge: Family;Available 24 hours/day Type of Home: Mobile home Home Access: Stairs to enter   Entrance Stairs-Number of Steps: 2 Home Layout: One level Home Equipment: Walker - 2 wheels;Walker - 4 wheels;Shower seat Additional Comments: Patient states she usually takes sponge baths    Prior Function Level of Independence: Needs assistance   Gait / Transfers Assistance Needed: household ambulator using RW  ADL's / Homemaking Assistance Needed: Community ADL's assisted by family        Hand Dominance        Extremity/Trunk Assessment   Upper Extremity Assessment Upper Extremity Assessment: Overall WFL for tasks assessed    Lower Extremity Assessment Lower Extremity Assessment: Overall WFL for tasks assessed    Cervical / Trunk Assessment Cervical / Trunk Assessment: Normal  Communication  Communication: No difficulties  Cognition Arousal/Alertness: Awake/alert Behavior During Therapy: WFL for tasks assessed/performed Overall Cognitive Status: Within Functional Limits for tasks assessed                                        General Comments       Exercises     Assessment/Plan    PT Assessment All further PT needs can be met in the next venue of care  PT Problem List Decreased activity tolerance;Decreased balance;Decreased mobility;Decreased strength       PT Treatment Interventions      PT Goals (Current goals can be found in the Care Plan section)  Acute Rehab PT Goals Patient Stated Goal: return home with family to assist PT Goal Formulation: With patient Time For Goal Achievement: 09/17/20 Potential to Achieve Goals: Good    Frequency     Barriers to discharge        Co-evaluation               AM-PAC PT "6 Clicks" Mobility  Outcome Measure Help needed turning from your back to your side while in a flat bed without using bedrails?: None Help needed moving from lying on your back to sitting on the side of a flat bed without using bedrails?: A Little Help needed moving to and from a bed to a chair (including a wheelchair)?: None Help needed standing up from a chair using your arms (e.g., wheelchair or bedside chair)?: None Help needed to walk in hospital room?: None Help needed climbing 3-5 steps with a railing? : A Little 6 Click Score: 22    End of Session   Activity Tolerance: Patient tolerated treatment well;Patient limited by fatigue Patient left: in bed;with call bell/phone within reach Nurse Communication: Mobility status PT Visit Diagnosis: Unsteadiness on feet (R26.81);Other abnormalities of gait and mobility (R26.89);Muscle weakness (generalized) (M62.81)    Time: 5750-5183 PT Time Calculation (min) (ACUTE ONLY): 16 min   Charges:   PT Evaluation $PT Eval Low Complexity: 1 Low PT Treatments $Therapeutic Activity: 8-22 mins        11:10 AM, 09/17/20 Lonell Grandchild, MPT Physical Therapist with [facility] [phone] office [phone] mobile phone

## 2020-09-17 NOTE — Progress Notes (Signed)
Transferrased self to bedside commode after lasix.  Voided 200.  Dressing changed to right leg

## 2020-09-18 DIAGNOSIS — D7589 Other specified diseases of blood and blood-forming organs: Secondary | ICD-10-CM

## 2020-09-18 DIAGNOSIS — K7460 Unspecified cirrhosis of liver: Secondary | ICD-10-CM | POA: Diagnosis not present

## 2020-09-18 DIAGNOSIS — R601 Generalized edema: Secondary | ICD-10-CM | POA: Diagnosis not present

## 2020-09-18 DIAGNOSIS — J449 Chronic obstructive pulmonary disease, unspecified: Secondary | ICD-10-CM | POA: Diagnosis not present

## 2020-09-18 LAB — COMPREHENSIVE METABOLIC PANEL
ALT: 19 U/L (ref 0–44)
AST: 36 U/L (ref 15–41)
Albumin: 2.4 g/dL — ABNORMAL LOW (ref 3.5–5.0)
Alkaline Phosphatase: 45 U/L (ref 38–126)
Anion gap: 10 (ref 5–15)
BUN: 16 mg/dL (ref 8–23)
CO2: 26 mmol/L (ref 22–32)
Calcium: 8.4 mg/dL — ABNORMAL LOW (ref 8.9–10.3)
Chloride: 106 mmol/L (ref 98–111)
Creatinine, Ser: 0.82 mg/dL (ref 0.44–1.00)
GFR, Estimated: >60 mL/min (ref >60)
Glucose, Bld: 77 mg/dL (ref 70–99)
Potassium: 3.3 mmol/L — ABNORMAL LOW (ref 3.5–5.1)
Sodium: 142 mmol/L (ref 135–145)
Total Bilirubin: 1.7 mg/dL — ABNORMAL HIGH (ref 0.3–1.2)
Total Protein: 5.5 g/dL — ABNORMAL LOW (ref 6.5–8.1)

## 2020-09-18 MED ORDER — PANTOPRAZOLE SODIUM 20 MG PO TBEC: 20.0000 mg | DELAYED_RELEASE_TABLET | Freq: Every day | ORAL | 2 refills | Status: AC

## 2020-09-18 MED ORDER — SPIRONOLACTONE 100 MG PO TABS: 100.0000 mg | ORAL_TABLET | Freq: Every day | ORAL | 2 refills | Status: AC

## 2020-09-18 MED ORDER — PANTOPRAZOLE SODIUM 20 MG PO TBEC: 20.0000 mg | DELAYED_RELEASE_TABLET | Freq: Every day | ORAL | 2 refills | Status: DC

## 2020-09-18 MED ORDER — NADOLOL 20 MG PO TABS: 20.0000 mg | ORAL_TABLET | Freq: Every day | ORAL | 1 refills | Status: AC

## 2020-09-18 MED ORDER — FUROSEMIDE 40 MG PO TABS: 40.0000 mg | ORAL_TABLET | Freq: Two times a day (BID) | ORAL | 2 refills | Status: AC

## 2020-09-18 NOTE — Discharge Summary (Signed)
Physician Discharge Summary  Alexis Blankenship WUJ:[id number] DOB: [birth year] DOA: 09/14/2020  PCP: [facility]  Admit date: 09/14/2020 Discharge date: 09/18/2020  Admitted From: Home Disposition: Home  Recommendations for Outpatient Follow-up:  1. Follow up with PCP in 1-2 weeks 2. Please obtain BMP/CBC in one week   Home Health: Outpatient PT Equipment/Devices:  Discharge Condition: Stable CODE STATUS: Full code Diet recommendation: Heart healthy  Brief/Interim Summary: 64 y/o female with NASH liver cirrhosisCOPD, morbid obesity presenting with 61-month history of progressive abdominal distention and discomfort. The patient has also been complaining of increasing leg edema with blister formation. She states that she had run out of her prescription medications, particularly her diuretics about 3 months prior to this admission. In addition, the patient has not seen her primary care provider for at least the same period of time. She endorses some indiscretion with fluid and salt intake. She denies any fevers, chills, chest pain, nausea, vomiting, diarrhea, coughing, hemoptysis. In the emergency department, the patient had a low-grade temperature 9 9.1 F. She was hemodynamically stable albeit with soft blood pressures. Systolic blood pressures were in the upper 90s to low 100s. The patient was started on intravenous furosemide and oral spironolactone. BMP was essentially unremarkable with serum creatinine 0.87. WBC 3.3, hemoglobin 13.1, platelets 92,000. BNP was 78.0.  Discharge Diagnoses:  Principal Problem:   Anasarca Active Problems:   Liver cirrhosis secondary to NASH (HCC)   Morbid obesity (HCC)   COPD (chronic obstructive pulmonary disease) (HCC)   Esophageal varices (HCC)   Leg wound, right   Tobacco abuse   Thrombocytopenia (HCC)   Other neutropenia (HCC)  Anasarca/decompensated liver cirrhosis -She was treated with IV Lasix and albumin  infusions -Reports improvement of volume status -Still has some edema, but no shortness of breath, able to lie flat -Continue spironolactone -Urine protein noted to be negative on urinalysis -Low-sodium diet -INR 1.2 -Requested paracentesis--minimal ascites, not enough for para -resume low-dose nadolol -I/Os--incomplete  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 -Monitor for signs of bleeding  Morbid obesity -BMI 67.61 -Lifestyle modification  Esophageal varices -No signs of bleeding presently  COPD/tobacco abuse -Tobacco cessation discussed -stable on RA -nicoderm patch  Right leg wound -not infected -Wound care consult appreciated  Discharge Instructions  Discharge Instructions    Ambulatory referral to Physical Therapy   Complete by: As directed    Diet - low sodium heart healthy   Complete by: As directed    Discharge wound care:   Complete by: As directed    Keep clean and cover with dry gauze   Increase activity slowly   Complete by: As directed      Allergies as of 09/18/2020   No Known Allergies     Medication List    TAKE these medications   albuterol 108 (90 Base) MCG/ACT inhaler Commonly known as: VENTOLIN HFA Inhale 2 puffs into the lungs every 6 (six) hours as needed for wheezing or shortness of breath.   diphenhydramine-acetaminophen 25-500 MG Tabs tablet Commonly known as: TYLENOL PM Take 1 tablet by mouth at bedtime as needed.   furosemide 40 MG tablet Commonly known as: Lasix Take 1 tablet (40 mg total) by mouth 2 (two) times daily. What changed: when to take this   nadolol 20 MG tablet Commonly known as: Corgard Take 1 tablet (20 mg total) by mouth daily. What changed:   medication strength  how much to take   pantoprazole 20 MG tablet Commonly known as:  Protonix Take 1 tablet (20 mg total) by mouth daily.   spironolactone 100 MG tablet Commonly known as: ALDACTONE Take 1 tablet (100 mg total) by mouth daily.             Discharge Care Instructions  (From admission, onward)         Start     Ordered   09/18/20 0000  Discharge wound care:       Comments: Keep clean and cover with dry gauze   09/18/20 1211          No Known Allergies  Consultations:     Procedures/Studies: [place] ASCITES (ABDOMEN LIMITED)  Result Date: 09/15/2020 CLINICAL DATA:  Evaluate for ascites. Abdominal distension. Cirrhosis. EXAM: LIMITED ABDOMEN ULTRASOUND FOR ASCITES TECHNIQUE: Limited ultrasound survey for ascites was performed in all four abdominal quadrants. COMPARISON:  09/26/2019 CT. FINDINGS: Trace right upper quadrant fluid. Minimal left upper quadrant fluid. No significant fluid in the lower quadrants. Not sufficient for paracentesis. IMPRESSION: Minimal abdominal ascites, insufficient for paracentesis. Electronically Signed   By: Jeronimo Greaves M.D.   On: 09/15/2020 15:39       Subjective: She is feeling better today. Feels her swelling is improving. Her legs feel less tight. Wants to go home.  Discharge Exam: Vitals:   09/17/20 0757 09/17/20 1247 09/17/20 2205 09/18/20 0708  BP: 107/66 114/70 99/66 119/81  Pulse: 87 86 88 93  Resp: 18 16 18 20   Temp: 98 F (36.7 C) 98.3 F (36.8 C) 98.6 F (37 C) 98.2 F (36.8 C)  TempSrc: Oral Oral Oral Oral  SpO2: 100% 96% 96% 95%  Weight:      Height:        General: Pt is alert, awake, not in acute distress Cardiovascular: RRR, S1/S2 +, no rubs, no gallops Respiratory: CTA bilaterally, no wheezing, no rhonchi Abdominal: Soft, NT, obese, bowel sounds + Extremities: 1+ edema, no cyanosis    The results of significant diagnostics from this hospitalization (including imaging, microbiology, ancillary and laboratory) are listed below for reference.     Microbiology: Recent Results (from the past 240 hour(s))  Respiratory Panel by RT PCR (Flu A&B, Covid) - Nasopharyngeal Swab     Status: None   Collection Time: 09/15/20 12:10 AM   Specimen:  Nasopharyngeal Swab  Result Value Ref Range Status   SARS Coronavirus 2 by RT PCR NEGATIVE NEGATIVE Final    Comment: (NOTE) SARS-CoV-2 target nucleic acids are NOT DETECTED.  The SARS-CoV-2 RNA is generally detectable in upper respiratoy specimens during the acute phase of infection. The lowest concentration of SARS-CoV-2 viral copies this assay can detect is 131 copies/mL. A negative result does not preclude SARS-Cov-2 infection and should not be used as the sole basis for treatment or other patient management decisions. A negative result may occur with  improper specimen collection/handling, submission of specimen other than nasopharyngeal swab, presence of viral mutation(s) within the areas targeted by this assay, and inadequate number of viral copies (<131 copies/mL). A negative result must be combined with clinical observations, patient history, and epidemiological information. The expected result is Negative.  Fact Sheet for Patients:  11/15/20  Fact Sheet for Healthcare Providers:  https://www.moore.com/  This test is no t yet approved or cleared by the https://www.young.biz/ FDA and  has been authorized for detection and/or diagnosis of SARS-CoV-2 by FDA under an Emergency Use Authorization (EUA). This EUA will remain  in effect (meaning this test can be used) for the duration of the  COVID-19 declaration under Section 564(b)(1) of the Act, 21 U.S.C. section 360bbb-3(b)(1), unless the authorization is terminated or revoked sooner.     Influenza A by PCR NEGATIVE NEGATIVE Final   Influenza B by PCR NEGATIVE NEGATIVE Final    Comment: (NOTE) The Xpert Xpress SARS-CoV-2/FLU/RSV assay is intended as an aid in  the diagnosis of influenza from Nasopharyngeal swab specimens and  should not be used as a sole basis for treatment. Nasal washings and  aspirates are unacceptable for Xpert Xpress SARS-CoV-2/FLU/RSV  testing.  Fact Sheet  for Patients: https://www.moore.com/  Fact Sheet for Healthcare Providers: https://www.young.biz/  This test is not yet approved or cleared by the [place] FDA and  has been authorized for detection and/or diagnosis of SARS-CoV-2 by  FDA under an Emergency Use Authorization (EUA). This EUA will remain  in effect (meaning this test can be used) for the duration of the  Covid-19 declaration under Section 564(b)(1) of the Act, 21  U.S.C. section 360bbb-3(b)(1), unless the authorization is  terminated or revoked. Performed at [facility], [address], [place], [place] [address]      Labs: BNP (last 3 results) Recent Labs    09/14/20 2150  BNP 78.0   Basic Metabolic Panel: Recent Labs  Lab 09/14/20 2150 09/15/20 0904 09/16/20 0639 09/17/20 0554 09/18/20 0552  NA 139 140 141 140 142  K 3.6 3.5 3.5 3.2* 3.3*  CL 107 106 105 105 106  CO2 27 25 26 26 26   GLUCOSE 96 84 84 80 77  BUN 12 12 14 15 16   CREATININE 0.87 0.73 0.86 0.90 0.82  CALCIUM 8.2* 8.0* 8.1* 8.1* 8.4*  MG  --  2.0  --   --   --   PHOS  --  3.1  --   --   --    Liver Function Tests: Recent Labs  Lab 09/15/20 0904 09/16/20 0639 09/17/20 0554 09/18/20 0552  AST 35 36 37 36  ALT 20 19 19 19   ALKPHOS 53 51 54 45  BILITOT 1.5* 1.9* 1.7* 1.7*  PROT 5.7* 5.6* 5.5* 5.5*  ALBUMIN 2.0* 2.1* 2.0* 2.4*   Recent Labs  Lab 09/14/20 2150  LIPASE 28   No results for input(s): AMMONIA in the last 168 hours. CBC: Recent Labs  Lab 09/14/20 2150 09/16/20 0639  WBC 3.3* 3.6*  HGB 13.1 12.4  HCT 39.5 38.8  MCV 104.5* 104.3*  PLT 92* 98*   Cardiac Enzymes: No results for input(s): CKTOTAL, CKMB, CKMBINDEX, TROPONINI in the last 168 hours. BNP: Invalid input(s): POCBNP CBG: No results for input(s): GLUCAP in the last 168 hours. D-Dimer No results for input(s): DDIMER in the last 72 hours. Hgb A1c No results for input(s): HGBA1C in the last 72 hours. Lipid  Profile No results for input(s): CHOL, HDL, LDLCALC, TRIG, CHOLHDL, LDLDIRECT in the last 72 hours. Thyroid function studies No results for input(s): TSH, T4TOTAL, T3FREE, THYROIDAB in the last 72 hours.  Invalid input(s): FREET3 Anemia work up No results for input(s): VITAMINB12, FOLATE, FERRITIN, TIBC, IRON, RETICCTPCT in the last 72 hours. Urinalysis    Component Value Date/Time   COLORURINE AMBER (A) 09/15/2020 0040   APPEARANCEUR HAZY (A) 09/15/2020 0040   LABSPEC 1.018 09/15/2020 0040   PHURINE 5.0 09/15/2020 0040   GLUCOSEU NEGATIVE 09/15/2020 0040   HGBUR SMALL (A) 09/15/2020 0040   BILIRUBINUR NEGATIVE 09/15/2020 0040   KETONESUR NEGATIVE 09/15/2020 0040   PROTEINUR NEGATIVE 09/15/2020 0040   NITRITE NEGATIVE 09/15/2020 0040  LEUKOCYTESUR TRACE (A) 09/15/2020 0040   Sepsis Labs Invalid input(s): PROCALCITONIN,  WBC,  LACTICIDVEN Microbiology Recent Results (from the past 240 hour(s))  Respiratory Panel by RT PCR (Flu A&B, Covid) - Nasopharyngeal Swab     Status: None   Collection Time: 09/15/20 12:10 AM   Specimen: Nasopharyngeal Swab  Result Value Ref Range Status   SARS Coronavirus 2 by RT PCR NEGATIVE NEGATIVE Final    Comment: (NOTE) SARS-CoV-2 target nucleic acids are NOT DETECTED.  The SARS-CoV-2 RNA is generally detectable in upper respiratoy specimens during the acute phase of infection. The lowest concentration of SARS-CoV-2 viral copies this assay can detect is 131 copies/mL. A negative result does not preclude SARS-Cov-2 infection and should not be used as the sole basis for treatment or other patient management decisions. A negative result may occur with  improper specimen collection/handling, submission of specimen other than nasopharyngeal swab, presence of viral mutation(s) within the areas targeted by this assay, and inadequate number of viral copies (<131 copies/mL). A negative result must be combined with clinical observations, patient history,  and epidemiological information. The expected result is Negative.  Fact Sheet for Patients:  https://www.moore.com/https://www.fda.gov/media/142436/download  Fact Sheet for Healthcare Providers:  https://www.young.biz/https://www.fda.gov/media/142435/download  This test is no t yet approved or cleared by the [place] FDA and  has been authorized for detection and/or diagnosis of SARS-CoV-2 by FDA under an Emergency Use Authorization (EUA). This EUA will remain  in effect (meaning this test can be used) for the duration of the COVID-19 declaration under Section 564(b)(1) of the Act, 21 U.S.C. section 360bbb-3(b)(1), unless the authorization is terminated or revoked sooner.     Influenza A by PCR NEGATIVE NEGATIVE Final   Influenza B by PCR NEGATIVE NEGATIVE Final    Comment: (NOTE) The Xpert Xpress SARS-CoV-2/FLU/RSV assay is intended as an aid in  the diagnosis of influenza from Nasopharyngeal swab specimens and  should not be used as a sole basis for treatment. Nasal washings and  aspirates are unacceptable for Xpert Xpress SARS-CoV-2/FLU/RSV  testing.  Fact Sheet for Patients: https://www.moore.com/https://www.fda.gov/media/142436/download  Fact Sheet for Healthcare Providers: https://www.young.biz/https://www.fda.gov/media/142435/download  This test is not yet approved or cleared by the [place] FDA and  has been authorized for detection and/or diagnosis of SARS-CoV-2 by  FDA under an Emergency Use Authorization (EUA). This EUA will remain  in effect (meaning this test can be used) for the duration of the  Covid-19 declaration under Section 564(b)(1) of the Act, 21  U.S.C. section 360bbb-3(b)(1), unless the authorization is  terminated or revoked. Performed at [facility], [address] [place], [place] [address]      Time coordinating discharge: 35mins  SIGNED:   Erick BlinksJehanzeb Addylin Manke, MD  [facility] 09/18/2020, 9:07 PM   If 7PM-7AM, please contact night-coverage [email or web address]

## 2020-09-18 NOTE — Plan of Care (Signed)
AVS reviewed with patient. No questions at this time. Discharged via wheelchair.

## 2020-09-18 NOTE — TOC Transition Note (Signed)
Transition of Care [facility]) - CM/SW Discharge Note  Patient Details  Name: Alexis Blankenship MRN: [id number] Date of Birth: Apr 28, 1956  [facility] CM/SW Contact:  Ewing Schlein, LCSW Phone Number: 09/18/2020, 12:28 PM  Clinical Narrative: HHPT/RN were recommended. CSW made referrals to [facility], [facility], [facility], [facility], [facility], and [facility]. [facility], [facility], and [facility] declined the referral. [facility] is not in-network with any Medicaid plans. [facility] does not serve patient's area. [facility] does not have enough nursing staff at this time. CSW set up OP PT referral. TOC signing off.   Final next level of care: OP Rehab Barriers to Discharge: No Home Care Agency will accept this patient  Patient Goals and CMS Choice Patient states their goals for this hospitalization and ongoing recovery are:: Discharge home CMS Medicare.gov Compare Post Acute Care list provided to:: Patient Choice offered to / list presented to : Patient  Discharge Plan and Services        DME Arranged: N/A DME Agency: NA HH Arranged: NA HH Agency: NA  Readmission Risk Interventions Readmission Risk Prevention Plan 09/16/2020  Medication Screening Complete  Transportation Screening Complete  Some recent data might be hidden

## 2021-01-06 DEATH — deceased

## 2021-04-17 IMAGING — CT CT ABD-PELV W/ CM
2 of 4 series · 17 of 46 positions shown, 19 images · IV contrast (omnipaque)
Comparison: CT 07/28/2015

CLINICAL DATA: Abdomen distension

EXAM:
CT ABDOMEN AND PELVIS WITH CONTRAST
TECHNIQUE: Multidetector CT imaging of the abdomen and pelvis was performed
using the standard protocol following bolus administration of
intravenous contrast.
CONTRAST:  100mL OMNIPAQUE IOHEXOL 300 MG/ML  SOLN

[Series 2: axial st · axial · 0.98mm/px · z∈[+1002,+1568]mm · 14 of 123 slices shown, 16 images]
[im 5/123  soft-tissue]
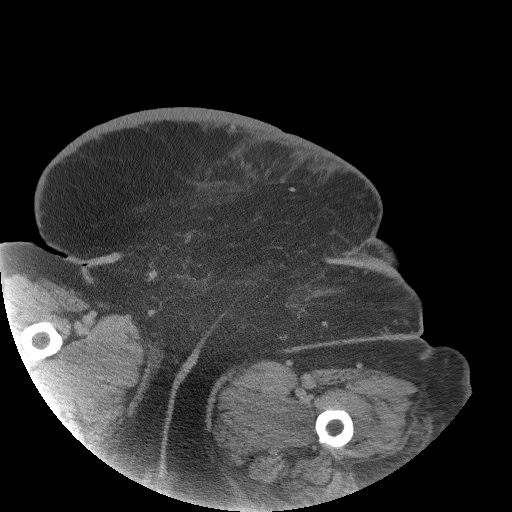
[im 5/123  bone]
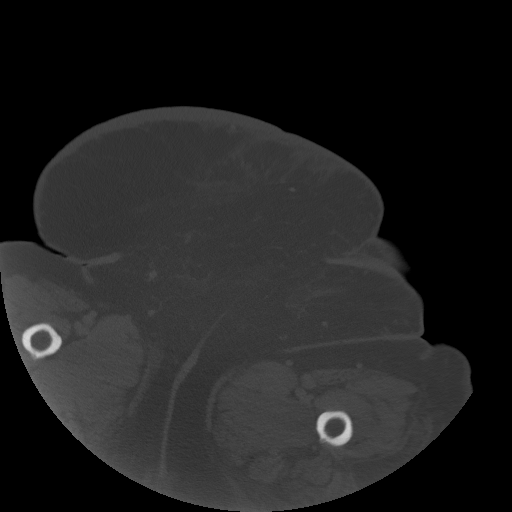
[im 15/123  soft-tissue]
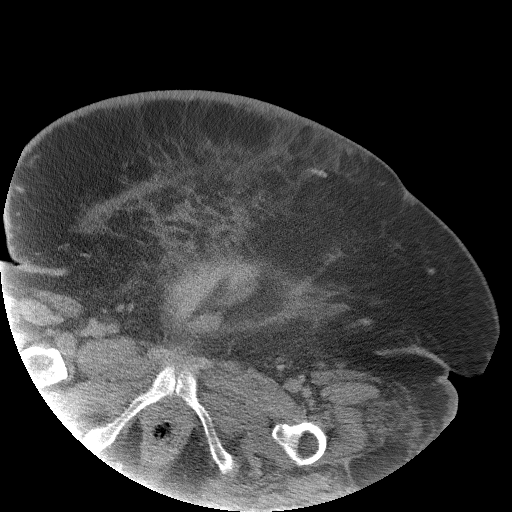
[im 25/123  soft-tissue]
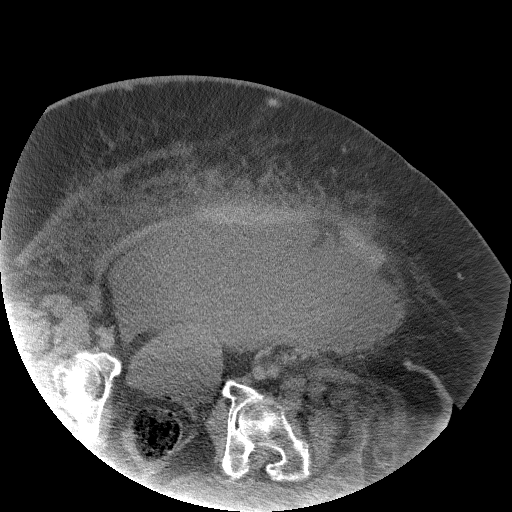
[im 35/123  soft-tissue]
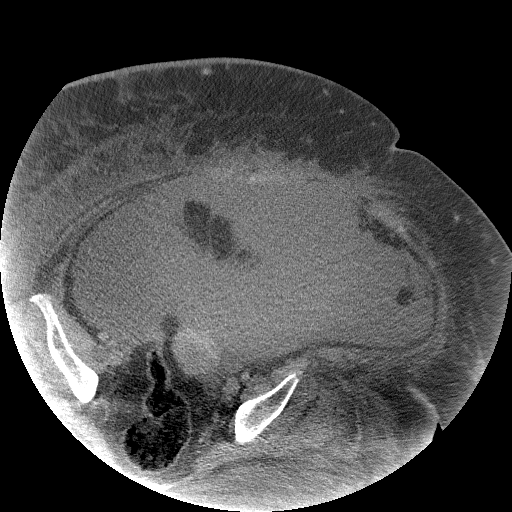
[im 40/123  soft-tissue]
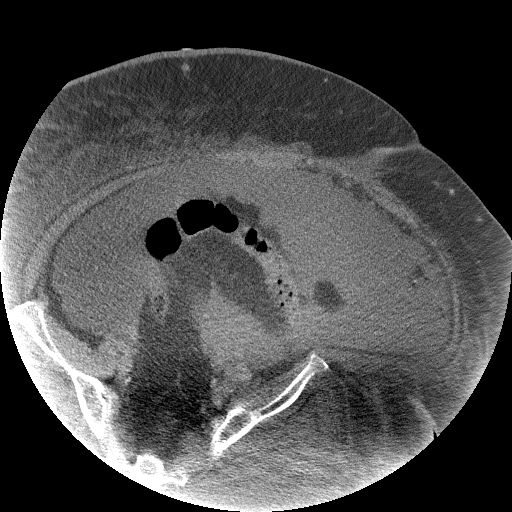
[im 49/123  soft-tissue]
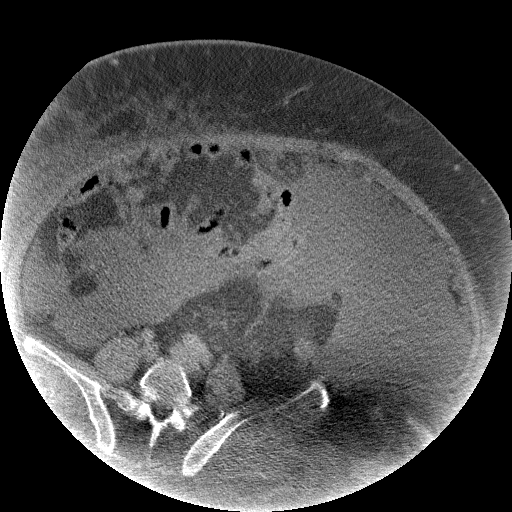
[im 59/123  soft-tissue]
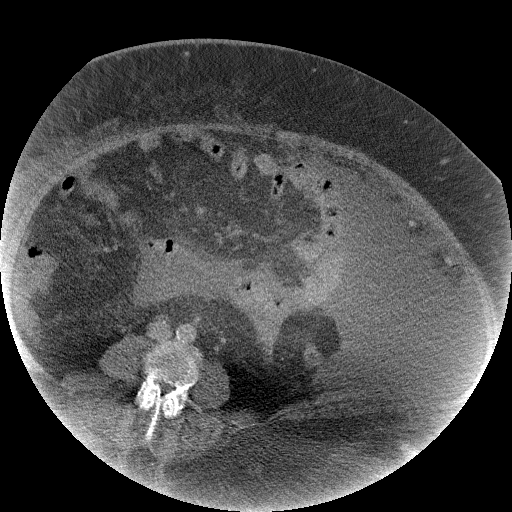
[im 64/123  soft-tissue]
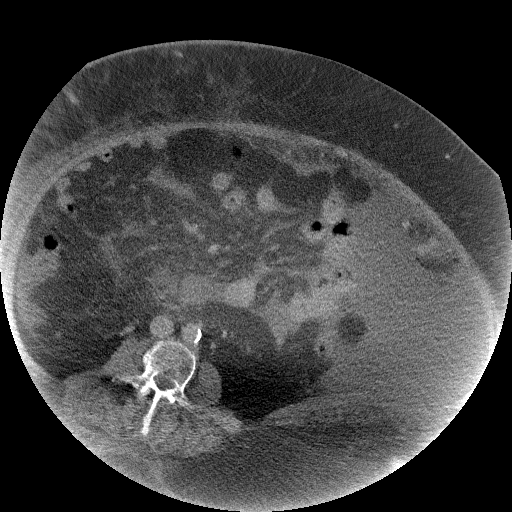
[im 74/123  soft-tissue]
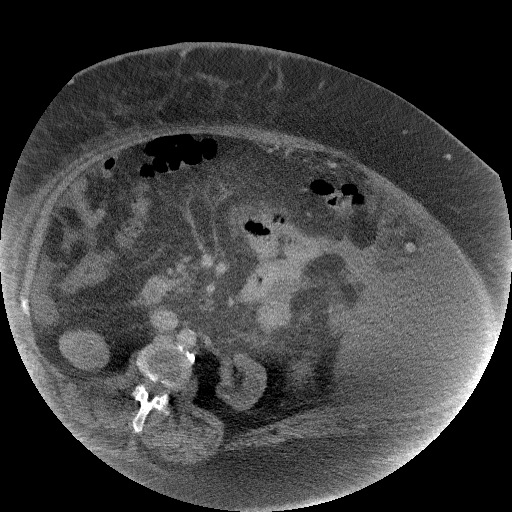
[im 74/123  bone]
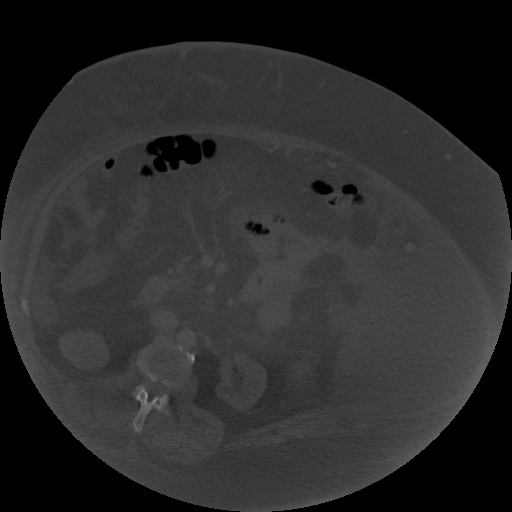
[im 83/123  soft-tissue]
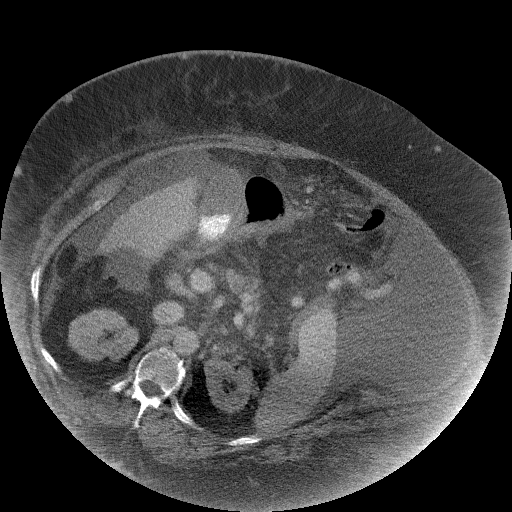
[im 93/123  soft-tissue]
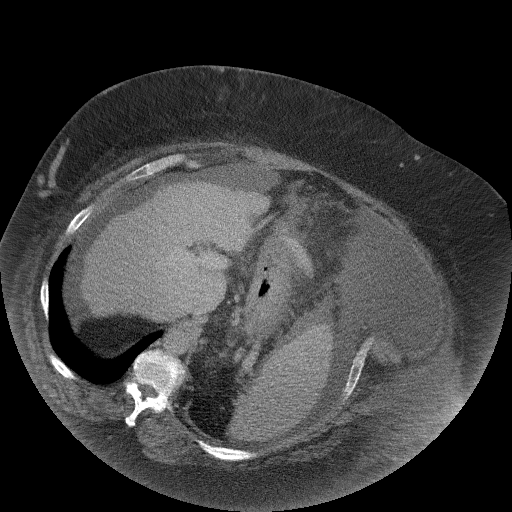
[im 98/123  soft-tissue]
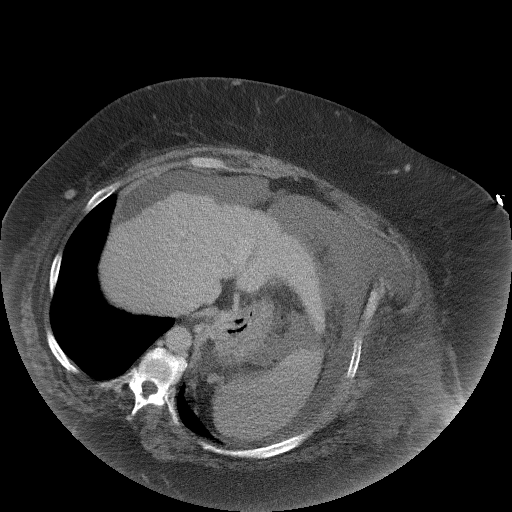
[im 108/123  soft-tissue]
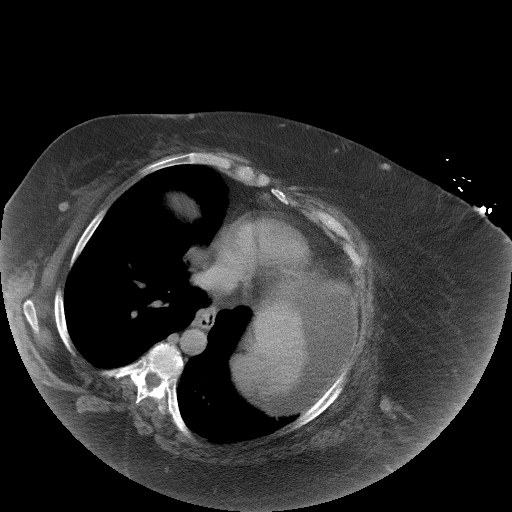
[im 118/123  soft-tissue]
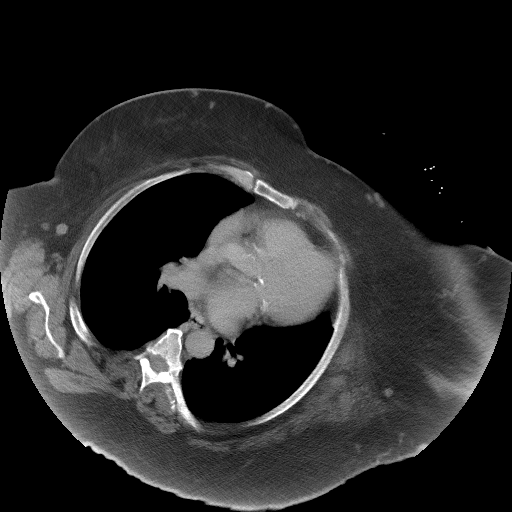

[Series 5: coronal st · coronal · 1.23mm/px · 3 of 142 slices shown]
[im 48/142  soft-tissue]
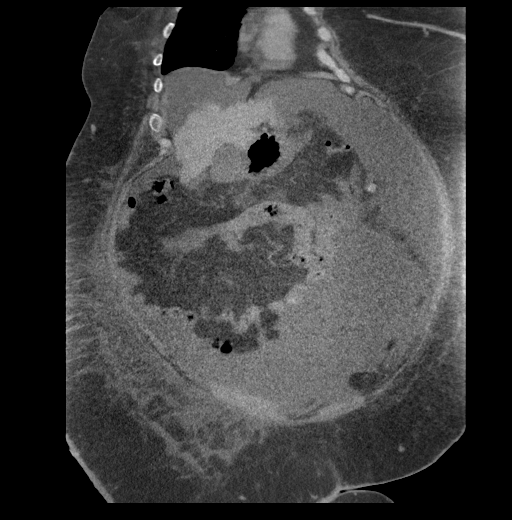
[im 63/142  soft-tissue]
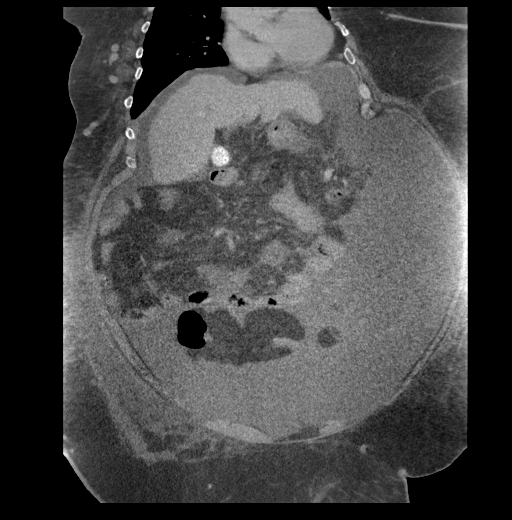
[im 79/142  soft-tissue]
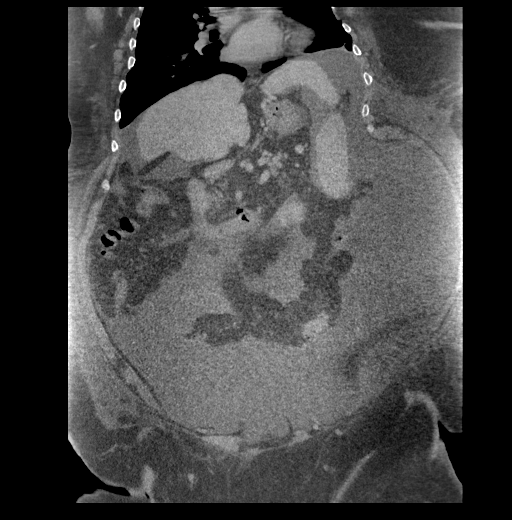

[17 of 46 positions shown; findings below may reference images not displayed]

FINDINGS: Lower chest: Lung bases demonstrate no acute consolidation or
pleural effusion. The heart size is within normal limits. Mitral
calcification.

Hepatobiliary: Lobulated liver contour consistent with cirrhosis. No
focal hepatic abnormality. Multiple stones are hyperdense sludge in
the gallbladder. No biliary dilatation

Pancreas: Atrophic.  No inflammatory change

Spleen: Enlarged measuring 17 cm craniocaudad.

Adrenals/Urinary Tract: Adrenal glands are unremarkable. Kidneys are
normal, without renal calculi, focal lesion, or hydronephrosis.
Bladder is unremarkable.

Stomach/Bowel: Stomach is within normal limits. Appendix not well
seen. No evidence of bowel wall thickening, distention, or
inflammatory changes.

Vascular/Lymphatic: Moderate aortic atherosclerosis. No aneurysm.
Subcentimeter upper abdominal and retroperitoneal nodes. Small
distal esophageal and perigastric varices. Recanalized paraumbilical
vein.

Reproductive: Uterus and bilateral adnexa are unremarkable.

Other: No free air. Moderate to large volume of ascites. Edema
within the subcutaneous fat of the body wall.

Musculoskeletal: No acute or suspicious osseous abnormality.
IMPRESSION: 1. Hepatic cirrhosis with portal hypertension as evidenced by
splenomegaly and upper abdominal varices.
2. Moderate to large volume of ascites in the abdomen
3. Gallstones are hyperdense sludge in the gallbladder

## 2022-04-07 IMAGING — US US ABDOMEN LIMITED
1 series · 8 of 8 positions shown · non-contrast
Comparison: 09/26/2019 CT.

CLINICAL DATA: Evaluate for ascites. Abdominal distension.
Cirrhosis.

EXAM:
LIMITED ABDOMEN ULTRASOUND FOR ASCITES
TECHNIQUE: Limited ultrasound survey for ascites was performed in all four
abdominal quadrants.

[Series 1: us paracentesis · 8 of 8 slices shown]
[im 1/8]
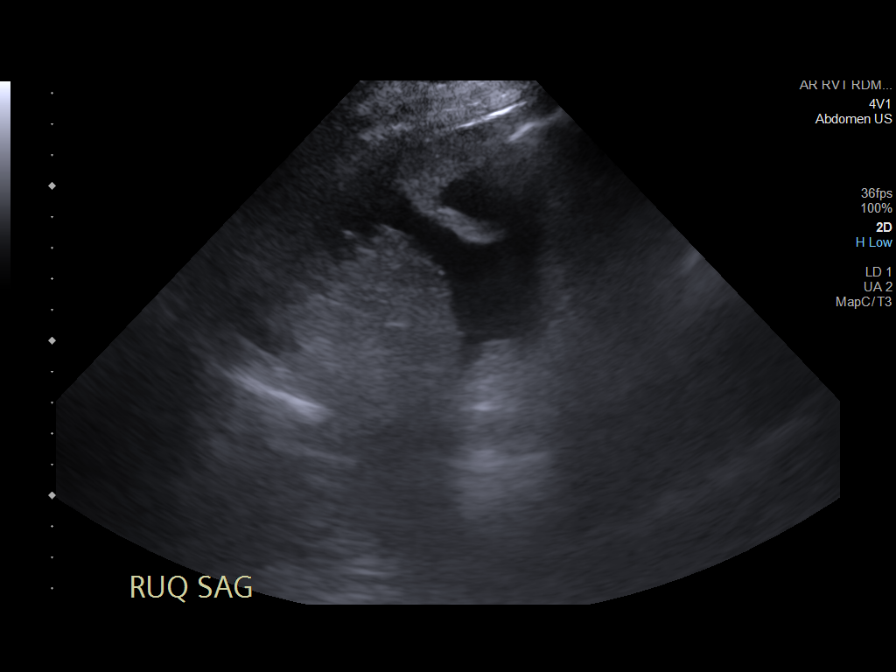
[im 2/8]
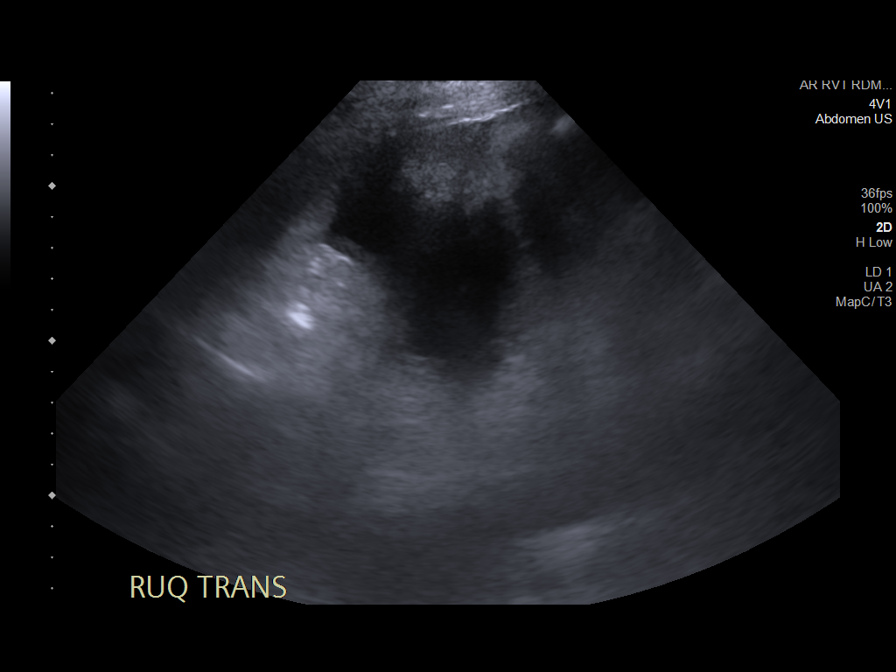
[im 3/8]
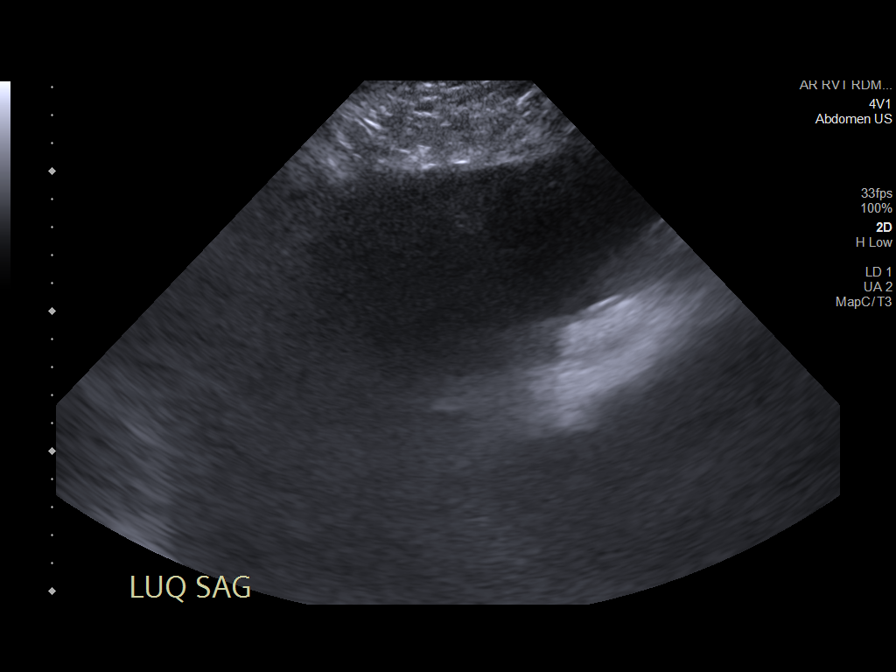
[im 4/8]
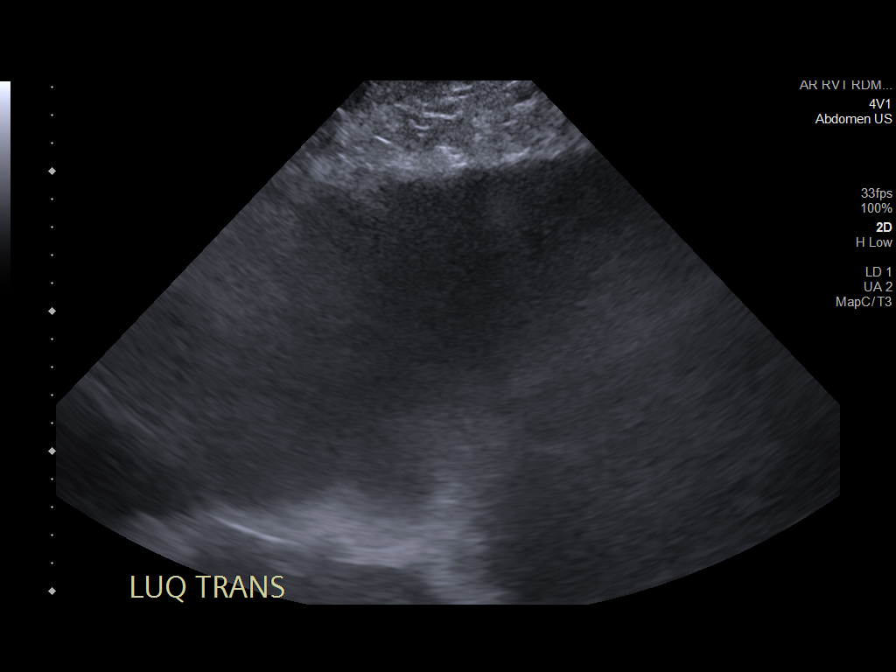
[im 5/8]
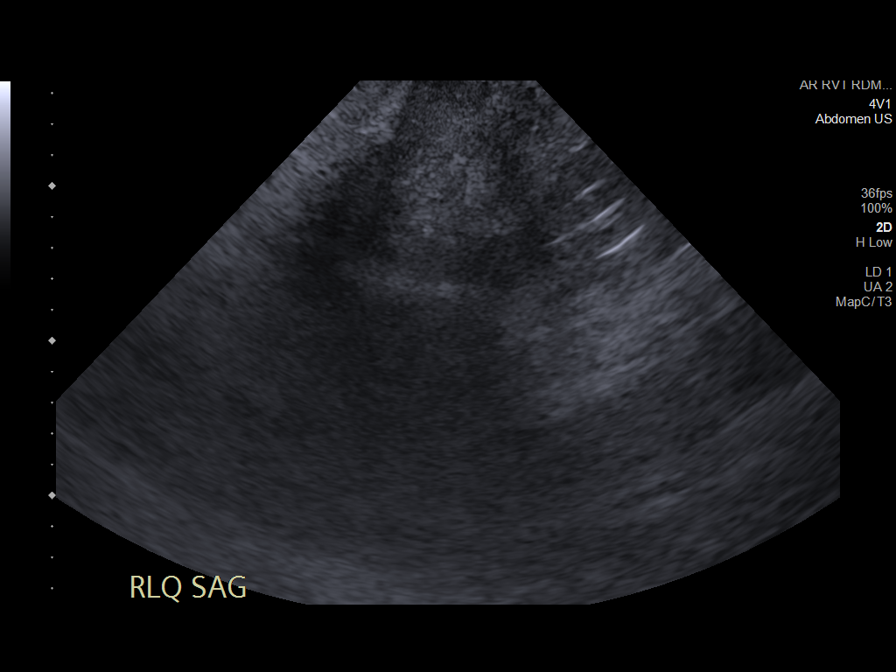
[im 6/8]
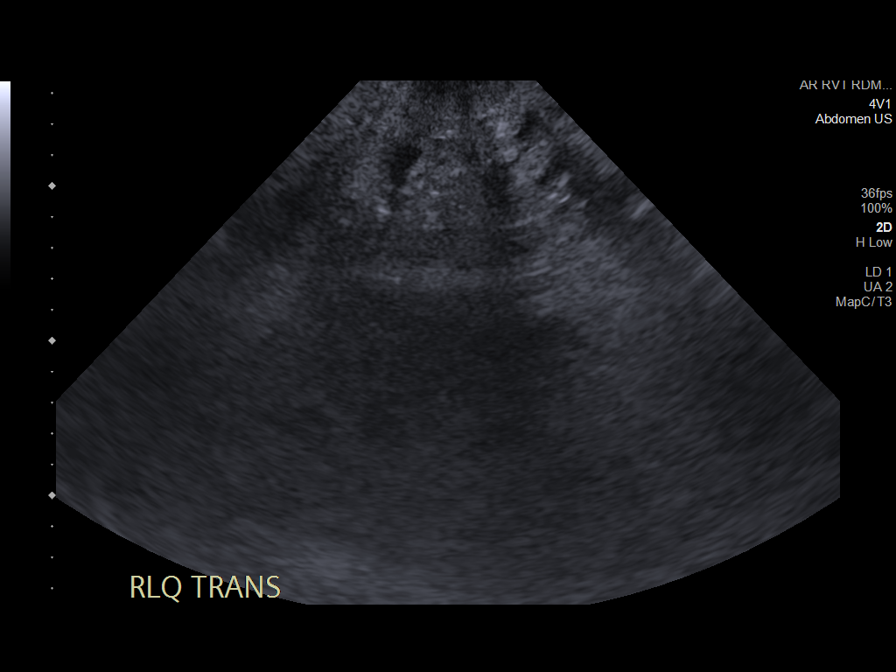
[im 7/8]
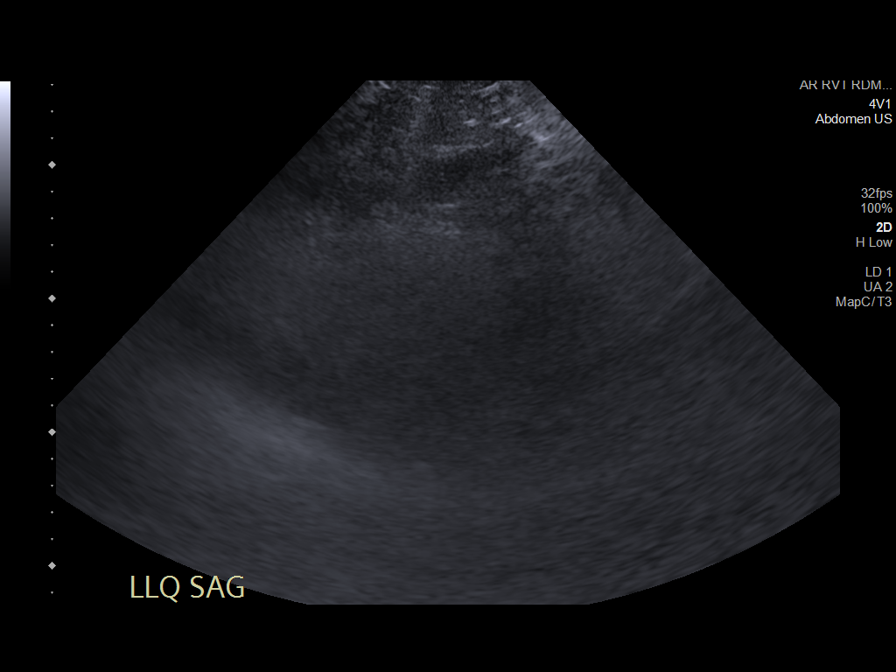
[im 8/8]
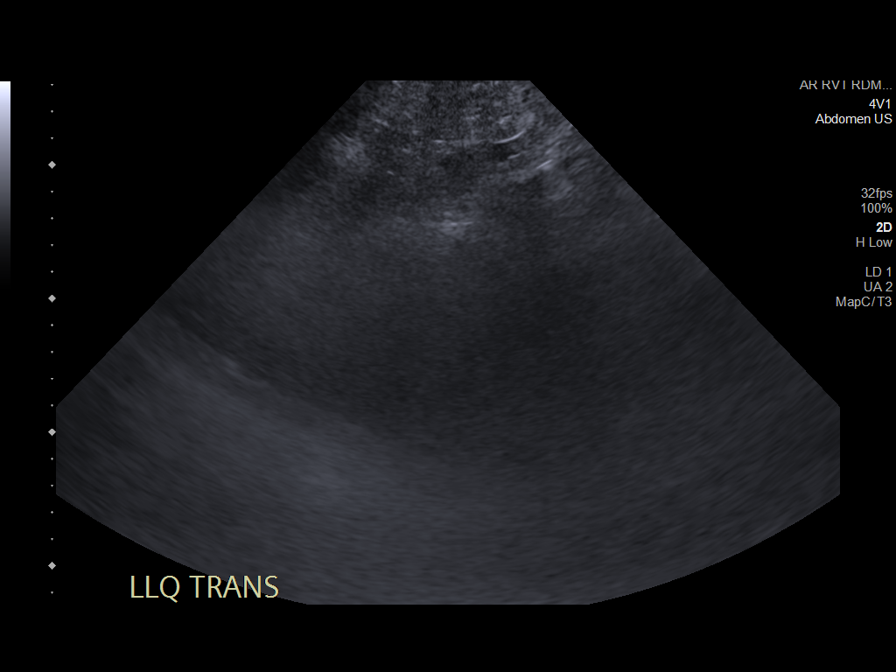

[8 of 8 positions shown; findings below may reference images not displayed]

FINDINGS: Trace right upper quadrant fluid. Minimal left upper quadrant fluid.
No significant fluid in the lower quadrants. Not sufficient for
paracentesis.
IMPRESSION: Minimal abdominal ascites, insufficient for paracentesis.
